# Patient Record
Sex: Female | Born: 1995 | Race: White | Hispanic: No | Smoking: Former smoker
Health system: Southern US, Community
[De-identification: ages and names within clinical notes are randomized; demographics above are authoritative.]

## PROBLEM LIST (undated history)

## (undated) ENCOUNTER — Inpatient Hospital Stay: Payer: Self-pay

## (undated) DIAGNOSIS — N83209 Unspecified ovarian cyst, unspecified side: Secondary | ICD-10-CM

## (undated) DIAGNOSIS — Z789 Other specified health status: Secondary | ICD-10-CM

## (undated) HISTORY — DX: Other specified health status: Z78.9

## (undated) HISTORY — PX: MOUTH SURGERY: SHX715

## (undated) HISTORY — DX: Unspecified ovarian cyst, unspecified side: N83.209

---
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
## 2017-01-01 ENCOUNTER — Emergency Department
Admission: EM | Admit: 2017-01-01 | Discharge: 2017-01-01 | Disposition: A | Discharge status: Home / Self Care | Payer: Self-pay | Attending: Emergency Medicine | Admitting: Emergency Medicine

## 2017-01-01 ENCOUNTER — Encounter: Payer: Self-pay | Admitting: Medical Oncology

## 2017-01-01 DIAGNOSIS — Z79899 Other long term (current) drug therapy: Secondary | ICD-10-CM | POA: Insufficient documentation

## 2017-01-01 DIAGNOSIS — Z791 Long term (current) use of non-steroidal anti-inflammatories (NSAID): Secondary | ICD-10-CM | POA: Insufficient documentation

## 2017-01-01 DIAGNOSIS — B86 Scabies: Secondary | ICD-10-CM | POA: Insufficient documentation

## 2017-01-01 DIAGNOSIS — L249 Irritant contact dermatitis, unspecified cause: Secondary | ICD-10-CM | POA: Insufficient documentation

## 2017-01-01 DIAGNOSIS — F17210 Nicotine dependence, cigarettes, uncomplicated: Secondary | ICD-10-CM | POA: Insufficient documentation

## 2017-01-01 MED ORDER — PERMETHRIN 5 % EX CREA: TOPICAL_CREAM | CUTANEOUS | 0 refills | Status: DC

## 2017-01-01 MED ORDER — TRIAMCINOLONE ACETONIDE 0.5 % EX OINT: 1.0000 "application " | TOPICAL_OINTMENT | Freq: Two times a day (BID) | CUTANEOUS | 0 refills | Status: DC

## 2017-01-01 NOTE — ED Triage Notes (Signed)
Itchy rash scattered on body x 1 week

## 2017-01-01 NOTE — Discharge Instructions (Signed)
You are being treated for a potential scabies exposure. Use the lotion as directed. Wait until the lotion is rinsed, to begin using the steroid cream as needed. Follow-up with your provider as needed. Take OTC Benadryl 25 mg and ranitidine (Zantac) 150 mg as directed for itch relief. Avoid long, hot showers, until symptoms resolve.

## 2017-01-01 NOTE — ED Provider Notes (Signed)
[facility] Provider Note ____________________________________________  Time seen: 1402  I have reviewed the triage vital signs and the nursing notes.  HISTORY  Chief Complaint  Rash  HPI Glendell DockerKacy L Piazza is a 21 y.o. female presents to the ED for evaluation of an itchy rash throughout the body for the last week. Patient admits to sleeping at a friend's home for about a week, prior to the onset of the rash. She presents now with multiple small papular lesions noted to the wrist, bilateral forearms, axilla, and around the waist. She also has some erythema into the back of the neck and scalp. She denies any fevers, chills, sweats. She also denies any similar symptoms in her girlfriend or other friends. She admits to showering twice daily as a compulsion. She denies any other contacts, exposures, or sensitivities.  History reviewed. No pertinent past medical history.  There are no active problems to display for this patient.  History reviewed. No pertinent surgical history.  Prior to Admission medications   Medication Sig Start Date End Date Taking? Authorizing Provider  meloxicam (MOBIC) 15 MG tablet Take 1 tablet (15 mg total) by mouth daily. 09/19/16   Cuthriell, Delorise RoyalsJonathan D, PA-C  metaxalone (SKELAXIN) 800 MG tablet Take 1/2 to 1 tab up to TID prn muscle pain 09/25/16   Halston Kintz, Charlesetta IvoryJenise V Bacon, PA-C  methocarbamol (ROBAXIN) 500 MG tablet Take 1 tablet (500 mg total) by mouth 4 (four) times daily. 09/19/16   Cuthriell, Delorise RoyalsJonathan D, PA-C  ondansetron (ZOFRAN ODT) 4 MG disintegrating tablet Take 1 tablet (4 mg total) by mouth every 8 (eight) hours as needed for nausea or vomiting. 09/25/16   Heide Brossart, Charlesetta IvoryJenise V Bacon, PA-C  permethrin (ELIMITE) 5 % cream Apply from neck to feet x 1 Rinse after 24 hours. 01/01/17   Jonica Bickhart, Charlesetta IvoryJenise V Bacon, PA-C  triamcinolone ointment (KENALOG) 0.5 % Apply 1 application topically 2 (two) times daily. 01/01/17    Viera Okonski, Charlesetta IvoryJenise V Bacon, PA-C    Allergies Patient has no known allergies.  No family history on file.  Social History Social History  Substance Use Topics  . Smoking status: Current Every Day Smoker    Packs/day: 0.25    Types: Cigarettes  . Smokeless tobacco: Never Used  . Alcohol use Yes     Comment: Social     Review of Systems  Constitutional: Negative for fever. Eyes: Negative for visual changes. ENT: Negative for sore throat. Cardiovascular: Negative for chest pain. Respiratory: Negative for shortness of breath. Musculoskeletal: Negative for back pain. Skin: Positive for rash. Neurological: Negative for headaches, focal weakness or numbness. ____________________________________________  PHYSICAL EXAM:  VITAL SIGNS: ED Triage Vitals  Enc Vitals Group     BP 01/01/17 1334 120/67     Pulse Rate 01/01/17 1334 71     Resp 01/01/17 1334 18     Temp 01/01/17 1334 98.3 F (36.8 C)     Temp Source 01/01/17 1334 Oral     SpO2 01/01/17 1334 97 %     Weight 01/01/17 1333 122 lb (55.3 kg)     Height --      Head Circumference --      Peak Flow --      Pain Score --      Pain Loc --      Pain Edu? --      Excl. in GC? --     Constitutional: Alert and oriented. Well appearing and in no distress. Head: Normocephalic  and atraumatic. Eyes: Conjunctivae are normal. Normal extraocular movements Neck: Supple. No thyromegaly. Hematological/Lymphatic/Immunological: No cervical lymphadenopathy. Cardiovascular: Normal rate, regular rhythm. Normal distal pulses. Respiratory: Normal respiratory effort. No wheezes/rales/rhonchi. Skin:  Skin is warm, dry and intact. Multiple scattered, slightly erythematous, follicular-type eruptions over the wrists, axilla, torso, and extremities.  ____________________________________________  INITIAL IMPRESSION / ASSESSMENT AND PLAN / ED COURSE  Patient with the ED evaluation of a itchy rash with an unknown etiology at this time. The  follicular appearing rash appears to be focused to her wrist and axilla as well as runaways. There is concern that she was out of her own personal space which could lead to possible scabies or bedbugs. These particular eruptions appear to be more itchy at night according to the patient. As such she'll be discharged with a prescription for permethrin lotion to use as directed. He is also advised to take over-the-counter Benadryl and Zantac for histamine blockade. A prescription for triamcinolone ointment is also provided that she may use after the treatment with the permethrin lotion. She should follow with her primary care provider or return to the ED as needed. ____________________________________________  FINAL CLINICAL IMPRESSION(S) / ED DIAGNOSES  Final diagnoses:  Scabies  Irritant contact dermatitis, unspecified trigger      Karmen Stabs, Charlesetta Ivory, PA-C 01/01/17 1816    Myrna Blazer, MD 01/01/17 2126

## 2017-01-01 NOTE — ED Notes (Signed)
See triage note  Has generalized rash for a few days areas noted to both arms,wrist ares and legs  No resp distress noted

## 2017-01-12 ENCOUNTER — Emergency Department (HOSPITAL_COMMUNITY)
Admission: EM | Admit: 2017-01-12 | Discharge: 2017-01-12 | Disposition: A | Discharge status: Home / Self Care | Payer: Self-pay | Attending: Emergency Medicine | Admitting: Emergency Medicine

## 2017-01-12 ENCOUNTER — Encounter (HOSPITAL_COMMUNITY): Payer: Self-pay | Admitting: Emergency Medicine

## 2017-01-12 DIAGNOSIS — Z79899 Other long term (current) drug therapy: Secondary | ICD-10-CM | POA: Insufficient documentation

## 2017-01-12 DIAGNOSIS — R21 Rash and other nonspecific skin eruption: Secondary | ICD-10-CM | POA: Insufficient documentation

## 2017-01-12 DIAGNOSIS — J029 Acute pharyngitis, unspecified: Secondary | ICD-10-CM

## 2017-01-12 DIAGNOSIS — F17210 Nicotine dependence, cigarettes, uncomplicated: Secondary | ICD-10-CM | POA: Insufficient documentation

## 2017-01-12 DIAGNOSIS — R590 Localized enlarged lymph nodes: Secondary | ICD-10-CM | POA: Insufficient documentation

## 2017-01-12 DIAGNOSIS — L42 Pityriasis rosea: Secondary | ICD-10-CM | POA: Insufficient documentation

## 2017-01-12 DIAGNOSIS — J3489 Other specified disorders of nose and nasal sinuses: Secondary | ICD-10-CM | POA: Insufficient documentation

## 2017-01-12 LAB — RAPID STREP SCREEN (MED CTR MEBANE ONLY): STREPTOCOCCUS, GROUP A SCREEN (DIRECT): NEGATIVE

## 2017-01-12 MED ORDER — CHLORHEXIDINE GLUCONATE 0.12 % MT SOLN: 15.0000 mL | Freq: Two times a day (BID) | OROMUCOSAL | 0 refills | Status: DC

## 2017-01-12 MED ORDER — HYDROCODONE-HOMATROPINE 5-1.5 MG/5ML PO SYRP: 5.0000 mL | ORAL_SOLUTION | Freq: Four times a day (QID) | ORAL | 0 refills | Status: DC | PRN

## 2017-01-12 NOTE — ED Triage Notes (Signed)
Patient reports sore throat for 2 days with occasional dry cough , denies fever or chills , respirations unlabored / airway intact .

## 2017-01-12 NOTE — ED Provider Notes (Signed)
[facility]-EMERGENCY DEPT Provider Note   CSN: [id number] Arrival date & time: 01/12/17  0128     History   Chief Complaint Chief Complaint  Patient presents with  . Sore Throat    HPI Jacqueline Villarreal is a 21 y.o. female.  HPI   21 year old female presenting complaining of sore throat. Patient states for the past 5 days she has had sinus congestion, occasional sneezing, having progressive worsening sore throat and now having ear pain. Increased low throat with swallowing and with talking. She denies associated fever, severe headache, neck pain, change in her voice, abdominal pain, nausea vomiting diarrhea, or shortness of breath. She also endorsed an itchy rash throughout her body ongoing for the past 2 weeks without any change in her environment. She denies any recent sick contact. She denies any hearing loss. She was seen a week ago for her rash and was prescribed permethrin cream which she used but it did provide any relief.  History reviewed. No pertinent past medical history.  There are no active problems to display for this patient.   History reviewed. No pertinent surgical history.  OB History    No data available       Home Medications    Prior to Admission medications   Medication Sig Start Date End Date Taking? Authorizing Provider  meloxicam (MOBIC) 15 MG tablet Take 1 tablet (15 mg total) by mouth daily. 09/19/16   Cuthriell, Delorise Royals, PA-C  metaxalone (SKELAXIN) 800 MG tablet Take 1/2 to 1 tab up to TID prn muscle pain 09/25/16   Menshew, Charlesetta Ivory, PA-C  methocarbamol (ROBAXIN) 500 MG tablet Take 1 tablet (500 mg total) by mouth 4 (four) times daily. 09/19/16   Cuthriell, Delorise Royals, PA-C  ondansetron (ZOFRAN ODT) 4 MG disintegrating tablet Take 1 tablet (4 mg total) by mouth every 8 (eight) hours as needed for nausea or vomiting. 09/25/16   Menshew, Charlesetta Ivory, PA-C  permethrin (ELIMITE) 5 % cream Apply from neck to feet x 1 Rinse after 24 hours. 01/01/17    Menshew, Charlesetta Ivory, PA-C  triamcinolone ointment (KENALOG) 0.5 % Apply 1 application topically 2 (two) times daily. 01/01/17   Menshew, Charlesetta Ivory, PA-C    Family History No family history on file.  Social History Social History  Substance Use Topics  . Smoking status: Current Every Day Smoker    Packs/day: 0.25    Types: Cigarettes  . Smokeless tobacco: Never Used  . Alcohol use Yes     Comment: Social      Allergies   Patient has no known allergies.   Review of Systems Review of Systems  All other systems reviewed and are negative.    Physical Exam Updated Vital Signs BP 133/76 (BP Location: Right Arm)   Pulse 91   Temp 98.1 F (36.7 C) (Oral)   Resp 18   SpO2 100%   Physical Exam  Constitutional: She appears well-developed and well-nourished. No distress.  Well-appearing in no acute discomfort.  HENT:  Head: Atraumatic.  Ears: Normal TMs bilaterally Nose: Mild rhinorrhea but normal turbinate Throat: Uvula is midline no tonsillar enlargement or exudates, no trismus  Eyes: Conjunctivae are normal.  Neck: Neck supple.  No nuchal rigidity  Cardiovascular: Normal rate and regular rhythm.   Pulmonary/Chest: Effort normal and breath sounds normal.  Abdominal: Soft. Bowel sounds are normal. She exhibits no distension. There is no tenderness.  No abdominal tenderness, no splenomegaly  Lymphadenopathy:    She  has cervical adenopathy.  Neurological: She is alert.  Skin: Rash (Multiple small ovoid salmon color lesion noted throughout body) noted.  Psychiatric: She has a normal mood and affect.  Nursing note and vitals reviewed.    ED Treatments / Results  Labs (all labs ordered are listed, but only abnormal results are displayed) Labs Reviewed  [facility]ID [facility])  CULTURE, GROUP A STREP [facility])    EKG  EKG Interpretation None       Radiology No results found.  Procedures Procedures (including critical care  time)  Medications Ordered in ED Medications - No data to display   Initial Impression / Assessment and Plan / ED Course  I have reviewed the triage vital signs and the nursing notes.  Pertinent labs & imaging results that were available during my care of the patient were reviewed by me and considered in my medical decision making (see chart for details).     BP 133/76 (BP Location: Right Arm)   Pulse 91   Temp 98.1 F (36.7 C) (Oral)   Resp 18   SpO2 100%    Final Clinical Impressions(s) / ED Diagnoses   Final diagnoses:  Pityriasis rosea  Viral pharyngitis    New Prescriptions New Prescriptions   CHLORHEXIDINE (PERIDEX) 0.12 % SOLUTION    Use as directed 15 mLs in the mouth or throat 2 (two) times daily.   HYDROCODONE-HOMATROPINE (HYCODAN) 5-1.5 MG/5ML SYRUP    Take 5 mLs by mouth every 6 (six) hours as needed for cough (or sore throat).   4:50 AM Patient here with itchy rash with appearance suggestive of arises prosaic. Reassurance given. She also has URI symptoms and sore throat. Her throat exam is unremarkable. No evidence to suggest deep tissue infection such as peritonsillar abscess or retropharyngeal abscess. Patient will receive symptomatic treatment. Return precaution discussed. She is stable for discharge. She is afebrile with stable normal vital sign.  In order to decrease risk of narcotic abuse. Pt's record were checked using the [place] Controlled Substance database.    Fayrene Helperran, Alyn Jurney, PA-C 01/12/17 [id number]    Gilda CreasePollina, Christopher J, MD 01/12/17 0630

## 2017-01-12 NOTE — ED Notes (Signed)
Pt reports generalized rash throughout body that does not hurt at the moment. Pt states itchiness has decreased past few days.

## 2017-01-12 NOTE — ED Notes (Signed)
ED Provider at bedside. 

## 2017-01-14 LAB — CULTURE, GROUP A STREP (THRC)

## 2017-05-20 ENCOUNTER — Encounter: Payer: Self-pay | Admitting: Emergency Medicine

## 2017-05-20 ENCOUNTER — Other Ambulatory Visit: Payer: Self-pay

## 2017-05-20 ENCOUNTER — Emergency Department
Admission: EM | Admit: 2017-05-20 | Discharge: 2017-05-20 | Disposition: A | Discharge status: Home / Self Care | Payer: Self-pay | Attending: Emergency Medicine | Admitting: Emergency Medicine

## 2017-05-20 DIAGNOSIS — J029 Acute pharyngitis, unspecified: Secondary | ICD-10-CM | POA: Insufficient documentation

## 2017-05-20 DIAGNOSIS — F17290 Nicotine dependence, other tobacco product, uncomplicated: Secondary | ICD-10-CM | POA: Insufficient documentation

## 2017-05-20 DIAGNOSIS — B349 Viral infection, unspecified: Secondary | ICD-10-CM | POA: Insufficient documentation

## 2017-05-20 LAB — POCT RAPID STREP A: Streptococcus, Group A Screen (Direct): NEGATIVE

## 2017-05-20 LAB — POCT PREGNANCY, URINE: Preg Test, Ur: NEGATIVE

## 2017-05-20 MED ORDER — HYDROCOD POLST-CPM POLST ER 10-8 MG/5ML PO SUER
5.0000 mL | Freq: Two times a day (BID) | ORAL | 0 refills | Status: DC
Start: 2017-05-20 — End: 2017-10-20

## 2017-05-20 MED ORDER — IBUPROFEN 100 MG/5ML PO SUSP
ORAL | Status: AC
  Filled 2017-05-20: qty 5

## 2017-05-20 MED ORDER — IBUPROFEN 100 MG/5ML PO SUSP
600.0000 mg | Freq: Once | ORAL | Status: AC
  Administered 2017-05-20: 600 mg via ORAL

## 2017-05-20 MED ORDER — HYDROCOD POLST-CPM POLST ER 10-8 MG/5ML PO SUER: 5.0000 mL | Freq: Two times a day (BID) | ORAL | 0 refills | Status: DC

## 2017-05-20 MED ORDER — HYDROCOD POLST-CPM POLST ER 10-8 MG/5ML PO SUER
5.0000 mL | Freq: Once | ORAL | Status: AC
  Administered 2017-05-20: 5 mL via ORAL
  Filled 2017-05-20: qty 5

## 2017-05-20 MED ORDER — PREDNISONE 20 MG PO TABS: 60.0000 mg | ORAL_TABLET | Freq: Once | ORAL | Status: DC

## 2017-05-20 MED ORDER — PREDNISONE 10 MG (21) PO TBPK: ORAL_TABLET | Freq: Every day | ORAL | 0 refills | Status: DC

## 2017-05-20 MED ORDER — ONDANSETRON 4 MG PO TBDP
4.0000 mg | ORAL_TABLET | Freq: Once | ORAL | Status: AC
  Administered 2017-05-20: 4 mg via ORAL
  Filled 2017-05-20: qty 1

## 2017-05-20 NOTE — ED Notes (Signed)
Reviewed d/c instructions, follow-up care, prescriptions with patient. Patient verbalized understanding.  

## 2017-05-20 NOTE — ED Triage Notes (Signed)
Pt to ED c/o sore throat, left neck pain, and nausea x4 days, fever today.  Patient taking mucinex at home.  Also last period September 21st.

## 2017-05-20 NOTE — ED Provider Notes (Signed)
[facility] Emergency Department Provider Note       Time seen: ----------------------------------------- 10:08 PM on 05/20/2017 -----------------------------------------    I have reviewed the triage vital signs and the nursing notes.  HISTORY   Chief Complaint Sore Throat and Fever    HPI Glendell DockerKacy L Shelvin is a 21 y.o. female with a history of sore throat, swollen lymph nodes and nausea for the past 4 days.  Patient reports she has had some fever today and she is taking Mucinex at home.  Pain is 8 out of 10, she describes it as aching.  Nothing makes it better or worse.  History reviewed. No pertinent past medical history.  There are no active problems to display for this patient.   History reviewed. No pertinent surgical history.  Allergies Patient has no known allergies.  Social History Social History   Tobacco Use  . Smoking status: Current Every Day Smoker    Types: E-cigarettes  . Smokeless tobacco: Never Used  Substance Use Topics  . Alcohol use: No    Frequency: Never  . Drug use: No   Review of Systems Constitutional: Negative for fever. Eyes: Negative for vision changes ENT:  Negative for congestion, positive for sore throat, lymphadenopathy Cardiovascular: Negative for chest pain. Respiratory: Negative for shortness of breath. Gastrointestinal: Negative for abdominal pain, positive for nausea Genitourinary: Negative for dysuria. Musculoskeletal: Negative for back pain. Skin: Negative for rash. Neurological: Negative for headaches, focal weakness or numbness.  All systems negative/normal/unremarkable except as stated in the HPI  ____________________________________________   PHYSICAL EXAM:  VITAL SIGNS: ED Triage Vitals  Enc Vitals Group     BP 05/20/17 2148 102/72     Pulse Rate 05/20/17 2148 (!) 108     Resp 05/20/17 2148 18     Temp 05/20/17 2148 100.2 F (37.9 C)     Temp Source 05/20/17 2148 Oral     SpO2  05/20/17 2148 100 %     Weight 05/20/17 2149 104 lb (47.2 kg)     Height 05/20/17 2149 5\' 4"  (1.626 m)     Head Circumference --      Peak Flow --      Pain Score 05/20/17 2151 8     Pain Loc --      Pain Edu? --      Excl. in GC? --    Constitutional: Alert and oriented. Well appearing and in no distress. Eyes: Conjunctivae are normal. Normal extraocular movements. ENT   Head: Normocephalic and atraumatic.   Nose: No congestion/rhinnorhea.   Mouth/Throat: Erythema with no tonsillar hypertrophy   Neck: Moderate bilateral anterior adenopathy Cardiovascular: Normal rate, regular rhythm. No murmurs, rubs, or gallops. Respiratory: Normal respiratory effort without tachypnea nor retractions. Breath sounds are clear and equal bilaterally. No wheezes/rales/rhonchi. Gastrointestinal: Soft and nontender. Normal bowel sounds Musculoskeletal: Nontender with normal range of motion in extremities. No lower extremity tenderness nor edema. Skin:  Skin is warm, dry and intact. No rash noted.  ED COURSE:  Pertinent labs & imaging results that were available during my care of the patient were reviewed by me and considered in my medical decision making (see chart for details). Patient presents for viral symptoms, we will assess with rapid strept and poc urine pregnancy   Procedures ____________________________________________   LABS (pertinent positives/negatives)  Labs Reviewed  CULTURE, GROUP A STREP [facility])  POCT RAPID STREP A  POCT PREGNANCY, URINE  POC URINE PREG, ED   ____________________________________________  DIFFERENTIAL DIAGNOSIS  Viral pharyngitis, strep pharyngitis, mononucleosis, pregnancy  FINAL ASSESSMENT AND PLAN  Pharyngitis   Plan: Patient had presented for sore throat. Patient's labs were negative for strep and pregnancy.  She has a very benign examination and likely has a viral etiology with reactive lymphadenopathy.  She will be discharged with a  steroid taper as well as cough and cold medications.  She is stable for outpatient follow-up   Emily FilbertWilliams, Claire Dolores E, MD   Note: This note was generated in part or whole with voice recognition software. Voice recognition is usually quite accurate but there are transcription errors that can and very often do occur. I apologize for any typographical errors that were not detected and corrected.     Emily FilbertWilliams, Clarie Camey E, MD 05/20/17 2231

## 2017-05-20 NOTE — ED Notes (Signed)
Rapid strep negative per Jeannett SeniorStephen, RN

## 2017-05-23 LAB — CULTURE, GROUP A STREP (THRC)

## 2017-10-04 ENCOUNTER — Encounter: Payer: Self-pay | Admitting: Emergency Medicine

## 2017-10-04 ENCOUNTER — Emergency Department
Admission: EM | Admit: 2017-10-04 | Discharge: 2017-10-04 | Disposition: A | Discharge status: Home / Self Care | Payer: Self-pay | Attending: Emergency Medicine | Admitting: Emergency Medicine

## 2017-10-04 ENCOUNTER — Other Ambulatory Visit: Payer: Self-pay

## 2017-10-04 DIAGNOSIS — Z79899 Other long term (current) drug therapy: Secondary | ICD-10-CM | POA: Insufficient documentation

## 2017-10-04 DIAGNOSIS — N760 Acute vaginitis: Secondary | ICD-10-CM | POA: Insufficient documentation

## 2017-10-04 DIAGNOSIS — F17210 Nicotine dependence, cigarettes, uncomplicated: Secondary | ICD-10-CM | POA: Insufficient documentation

## 2017-10-04 DIAGNOSIS — N939 Abnormal uterine and vaginal bleeding, unspecified: Secondary | ICD-10-CM

## 2017-10-04 LAB — URINALYSIS, COMPLETE (UACMP) WITH MICROSCOPIC
BACTERIA UA: NONE SEEN
Bilirubin Urine: NEGATIVE
Glucose, UA: NEGATIVE mg/dL
Ketones, ur: NEGATIVE mg/dL
Leukocytes, UA: NEGATIVE
Nitrite: NEGATIVE
PROTEIN: NEGATIVE mg/dL
Specific Gravity, Urine: 1.024 (ref 1.005–1.030)
pH: 6 (ref 5.0–8.0)

## 2017-10-04 LAB — WET PREP, GENITAL
CLUE CELLS WET PREP: NONE SEEN
Sperm: NONE SEEN
TRICH WET PREP: NONE SEEN
Yeast Wet Prep HPF POC: NONE SEEN

## 2017-10-04 LAB — POCT PREGNANCY, URINE: Preg Test, Ur: NEGATIVE

## 2017-10-04 LAB — CHLAMYDIA/NGC RT PCR (ARMC ONLY)
Chlamydia Tr: NOT DETECTED
N GONORRHOEAE: NOT DETECTED

## 2017-10-04 MED ORDER — METRONIDAZOLE 500 MG PO TABS: 500.0000 mg | ORAL_TABLET | Freq: Two times a day (BID) | ORAL | 0 refills | Status: AC

## 2017-10-04 NOTE — ED Notes (Signed)
Pt discharged home after verbalizing understanding of discharge instructions; nad noted. 

## 2017-10-04 NOTE — ED Notes (Signed)
See triage note  Presents with some abd cramping and then she noticed some blood in urine
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
## 2017-10-20 ENCOUNTER — Other Ambulatory Visit: Payer: Self-pay

## 2017-10-20 ENCOUNTER — Emergency Department: Payer: Self-pay

## 2017-10-20 ENCOUNTER — Emergency Department
Admission: EM | Admit: 2017-10-20 | Discharge: 2017-10-20 | Disposition: A | Discharge status: Home / Self Care | Payer: Self-pay | Attending: Emergency Medicine | Admitting: Emergency Medicine

## 2017-10-20 DIAGNOSIS — G8929 Other chronic pain: Secondary | ICD-10-CM | POA: Insufficient documentation

## 2017-10-20 DIAGNOSIS — F17290 Nicotine dependence, other tobacco product, uncomplicated: Secondary | ICD-10-CM | POA: Insufficient documentation

## 2017-10-20 DIAGNOSIS — M545 Low back pain: Secondary | ICD-10-CM | POA: Insufficient documentation

## 2017-10-20 LAB — URINALYSIS, COMPLETE (UACMP) WITH MICROSCOPIC
Bilirubin Urine: NEGATIVE
Glucose, UA: NEGATIVE mg/dL
Hgb urine dipstick: NEGATIVE
KETONES UR: NEGATIVE mg/dL
Nitrite: NEGATIVE
PH: 5 (ref 5.0–8.0)
Protein, ur: NEGATIVE mg/dL
Specific Gravity, Urine: 1.02 (ref 1.005–1.030)

## 2017-10-20 LAB — POCT PREGNANCY, URINE: Preg Test, Ur: NEGATIVE

## 2017-10-20 MED ORDER — MELOXICAM 15 MG PO TABS: 15.0000 mg | ORAL_TABLET | Freq: Every day | ORAL | 0 refills | Status: DC

## 2017-10-20 MED ORDER — KETOROLAC TROMETHAMINE 30 MG/ML IJ SOLN
30.0000 mg | Freq: Once | INTRAMUSCULAR | Status: AC
  Administered 2017-10-20: 30 mg via INTRAMUSCULAR
  Filled 2017-10-20: qty 1
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
## 2017-10-20 NOTE — ED Notes (Signed)
Pt verbalizes understanding of d/c instructions, medications and f/u 
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
## 2017-10-20 NOTE — Discharge Instructions (Addendum)
You will need to establish a primary care provider.  You may also [facility].  Their information is listed on your discharge papers.  Begin taking meloxicam 15 mg daily with food.  Take this medication every day for the next 30 days.  You may use ice or heat to your back.  Move frequently to avoid soreness and stiffness.
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
## 2018-02-22 ENCOUNTER — Emergency Department
Admission: EM | Admit: 2018-02-22 | Discharge: 2018-02-22 | Disposition: A | Discharge status: Home / Self Care | Payer: Self-pay | Attending: Emergency Medicine | Admitting: Emergency Medicine

## 2018-02-22 ENCOUNTER — Emergency Department: Payer: Self-pay

## 2018-02-22 ENCOUNTER — Encounter: Payer: Self-pay | Admitting: Emergency Medicine

## 2018-02-22 ENCOUNTER — Other Ambulatory Visit: Payer: Self-pay

## 2018-02-22 DIAGNOSIS — R102 Pelvic and perineal pain: Secondary | ICD-10-CM | POA: Insufficient documentation

## 2018-02-22 DIAGNOSIS — R1030 Lower abdominal pain, unspecified: Secondary | ICD-10-CM | POA: Insufficient documentation

## 2018-02-22 DIAGNOSIS — F17210 Nicotine dependence, cigarettes, uncomplicated: Secondary | ICD-10-CM | POA: Insufficient documentation

## 2018-02-22 DIAGNOSIS — Z79899 Other long term (current) drug therapy: Secondary | ICD-10-CM | POA: Insufficient documentation

## 2018-02-22 LAB — COMPREHENSIVE METABOLIC PANEL
ALT: 12 U/L (ref 0–44)
AST: 22 U/L (ref 15–41)
Albumin: 4.7 g/dL (ref 3.5–5.0)
Alkaline Phosphatase: 52 U/L (ref 38–126)
Anion gap: 7 (ref 5–15)
BILIRUBIN TOTAL: 1.1 mg/dL (ref 0.3–1.2)
BUN: 8 mg/dL (ref 6–20)
CHLORIDE: 107 mmol/L (ref 98–111)
CO2: 26 mmol/L (ref 22–32)
CREATININE: 0.54 mg/dL (ref 0.44–1.00)
Calcium: 9.4 mg/dL (ref 8.9–10.3)
Glucose, Bld: 110 mg/dL — ABNORMAL HIGH (ref 70–99)
Potassium: 3.7 mmol/L (ref 3.5–5.1)
Sodium: 140 mmol/L (ref 135–145)
TOTAL PROTEIN: 7.4 g/dL (ref 6.5–8.1)

## 2018-02-22 LAB — URINALYSIS, COMPLETE (UACMP) WITH MICROSCOPIC
BILIRUBIN URINE: NEGATIVE
Bacteria, UA: NONE SEEN
GLUCOSE, UA: NEGATIVE mg/dL
HGB URINE DIPSTICK: NEGATIVE
KETONES UR: 20 mg/dL — AB
LEUKOCYTES UA: NEGATIVE
NITRITE: NEGATIVE
PH: 5 (ref 5.0–8.0)
PROTEIN: NEGATIVE mg/dL
Specific Gravity, Urine: 1.006 (ref 1.005–1.030)

## 2018-02-22 LAB — CHLAMYDIA/NGC RT PCR (ARMC ONLY)
Chlamydia Tr: NOT DETECTED
N gonorrhoeae: NOT DETECTED

## 2018-02-22 LAB — CBC WITH DIFFERENTIAL/PLATELET
BASOS ABS: 0 10*3/uL (ref 0–0.1)
BASOS PCT: 0 %
EOS ABS: 0.1 10*3/uL (ref 0–0.7)
Eosinophils Relative: 2 %
HCT: 40 % (ref 35.0–47.0)
Hemoglobin: 13.7 g/dL (ref 12.0–16.0)
LYMPHS ABS: 1.3 10*3/uL (ref 1.0–3.6)
Lymphocytes Relative: 23 %
MCH: 30.4 pg (ref 26.0–34.0)
MCHC: 34.3 g/dL (ref 32.0–36.0)
MCV: 88.7 fL (ref 80.0–100.0)
Monocytes Absolute: 0.4 10*3/uL (ref 0.2–0.9)
Monocytes Relative: 7 %
NEUTROS PCT: 68 %
Neutro Abs: 3.8 10*3/uL (ref 1.4–6.5)
Platelets: 161 10*3/uL (ref 150–440)
RBC: 4.51 MIL/uL (ref 3.80–5.20)
RDW: 12.8 % (ref 11.5–14.5)
WBC: 5.7 10*3/uL (ref 3.6–11.0)

## 2018-02-22 LAB — LIPASE, BLOOD: LIPASE: 36 U/L (ref 11–51)

## 2018-02-22 LAB — POCT PREGNANCY, URINE: Preg Test, Ur: NEGATIVE

## 2018-02-22 MED ORDER — SODIUM CHLORIDE 0.9 % IV BOLUS
1000.0000 mL | Freq: Once | INTRAVENOUS | Status: AC
  Administered 2018-02-22: 1000 mL via INTRAVENOUS

## 2018-02-22 NOTE — ED Triage Notes (Signed)
Patient ambulatory to triage with steady gait, without difficulty or distress noted; pt reports x 2wks having nausea and lower abd pain; denies hx of same

## 2018-02-22 NOTE — ED Notes (Signed)
Patient went to the bathroom, and collected a UA sample. Sent to the lab.

## 2018-02-22 NOTE — ED Provider Notes (Addendum)
Ultrasound shows a small to moderate amount of free fluid.  This could be due to a cyst otherwise ultrasound is normal patient reports the pain is considerably better she does not want to take any medicine but does ask for blood to be drawn to check for STD.  She is not particularly interested in a pelvic exam however.  Patient does want to go home.  We will collect the urine and blood let her go she will return if she is worse if the pain recurs if she gets a fever vomiting or feels sicker at all.   Arnaldo NatalMalinda, Rashied Corallo F, MD 02/22/18 [phone]    Arnaldo NatalMalinda, Bryana Froemming F, MD 02/22/18 [phone]

## 2018-02-22 NOTE — ED Provider Notes (Signed)
[facility] Emergency Department Provider Note ____________________________________________   First MD Initiated Contact with Patient 02/22/18 0507     (approximate)  I have reviewed the triage vital signs and the nursing notes.   HISTORY  Chief Complaint Abdominal Pain    HPI Jacqueline Villarreal is a 22 y.o. female with PMH as noted below who presents with lower abdominal pain, acute onset approximately 4 hours ago, severe in intensity, and radiating around to bilateral flanks.  She states that the pain is slightly worse on the left.  Patient also reports some nausea over the last 2 weeks but no vomiting.  She states it is worse after she eats.  She also reports some diarrhea during the last 2 weeks.  She denies fever, urinary symptoms, or vaginal discharge but she does report some spotting.  She states that her last period was over 1 month ago.  No prior history of this pain.   History reviewed. No pertinent past medical history.  There are no active problems to display for this patient.   Past Surgical History:  Procedure Laterality Date  . MOUTH SURGERY      Prior to Admission medications   Medication Sig Start Date End Date Taking? Authorizing Provider  meloxicam (MOBIC) 15 MG tablet Take 1 tablet (15 mg total) by mouth daily. 10/20/17 10/20/18  Tommi RumpsSummers, Rhonda L, PA-C    Allergies Patient has no known allergies.  No family history on file.  Social History Social History   Tobacco Use  . Smoking status: Current Every Day Smoker    Types: E-cigarettes  . Smokeless tobacco: Never Used  Substance Use Topics  . Alcohol use: No    Frequency: Never  . Drug use: No    Review of Systems  Constitutional: No fever. Eyes: No redness. ENT: No sore throat. Cardiovascular: Denies chest pain. Respiratory: Denies shortness of breath. Gastrointestinal: Positive for nausea and diarrhea.  Genitourinary: Negative for dysuria.  Musculoskeletal:  Negative for back pain. Skin: Negative for rash. Neurological: Negative for headache.   ____________________________________________   PHYSICAL EXAM:  VITAL SIGNS: ED Triage Vitals  Enc Vitals Group     BP 02/22/18 0501 100/83     Pulse Rate 02/22/18 0501 65     Resp 02/22/18 0501 16     Temp 02/22/18 0501 98.3 F (36.8 C)     Temp Source 02/22/18 0501 Oral     SpO2 02/22/18 0501 100 %     Weight 02/22/18 0500 105 lb (47.6 kg)     Height 02/22/18 0500 5\' 4"  (1.626 m)     Head Circumference --      Peak Flow --      Pain Score 02/22/18 0500 8     Pain Loc --      Pain Edu? --      Excl. in GC? --     Constitutional: Alert and oriented.  Slightly uncomfortable appearing but in no acute distress.   Eyes: Conjunctivae are normal.  Head: Atraumatic. Nose: No congestion/rhinnorhea. Mouth/Throat: Mucous membranes are moist.   Neck: Normal range of motion.  Cardiovascular: Good peripheral circulation. Respiratory: Normal respiratory effort.  Gastrointestinal: Soft with bilateral suprapubic and lower quadrant tenderness slightly worse on the left. No distention.  Genitourinary: No flank tenderness. Musculoskeletal:  Extremities warm and well perfused.  Neurologic:  Normal speech and language. No gross focal neurologic deficits are appreciated.  Skin:  Skin is warm and dry. No rash noted. Psychiatric: Mood  and affect are normal. Speech and behavior are normal.  ____________________________________________   LABS (all labs ordered are listed, but only abnormal results are displayed)  Labs Reviewed  COMPREHENSIVE METABOLIC PANEL - Abnormal; Notable for the following components:      Result Value   Glucose, Bld 110 (*)    All other components within normal limits  CBC WITH DIFFERENTIAL/PLATELET  LIPASE, BLOOD  URINALYSIS, COMPLETE (UACMP) WITH MICROSCOPIC  POC URINE PREG, ED    ____________________________________________  EKG   ____________________________________________  RADIOLOGY  US pelvis: Pending  ____________________________________________   PROCEDURES  Procedure(s) performed: No  Procedures  Critical Care performed: No ____________________________________________   INITIAL IMPRESSION / ASSESSMENT AND PLAN / ED COURSE  Pertinent labs & imaging results that were available during my care of the patient were reviewed by me and considered in my medical decision making (see chart for details).  22 year old female with PMH as noted above presents with acute onset of lower abdominal pain over the last several hours, as well as some nausea and diarrhea over the last 2 weeks.  On exam, the patient is relatively well-appearing, the vital signs are normal, and the abdomen is soft but tender in bilateral lower quadrants, somewhat worse on the left.  Given the acute and severe nature of the pain as well as the location, differential includes most likely ruptured ovarian cyst, mittelschmerz, or less likely ovarian torsion.  Differential also includes colitis, diverticulitis, or less likely appendicitis.  We will obtain labs, pelvic ultrasound, and reassess.  If pelvic ultrasound is nondiagnostic, I will consider CT.  The patient declines any pain medication at this time.  ----------------------------------------- 6:53 AM on 02/22/2018 -----------------------------------------  Lab work-up is unremarkable.  The patient appears more comfortable.  She is pending pelvic ultrasound.  I am sending the patient out to the oncoming physician Dr. Darnelle CatalanMalinda. ____________________________________________   FINAL CLINICAL IMPRESSION(S) / ED DIAGNOSES  Final diagnoses:  Pelvic pain      NEW MEDICATIONS STARTED DURING THIS VISIT:  New Prescriptions   No medications on file     Note:  This document was prepared using Dragon voice recognition software  and may include unintentional dictation errors.    Dionne BucySiadecki, Dhanvi Boesen, MD 02/22/18 [phone]

## 2018-02-22 NOTE — ED Provider Notes (Signed)
Recheck one more time before patient goes patient's abdominal pain is a lot better she is able to move without any pain she does have a little bit of residual pain on exam to palpation only again with her normal blood work and her reluctance to do anything else we will let her go she does promise to return at once if she is worse.   Arnaldo NatalMalinda, Paul F, MD 02/22/18 [phone]

## 2018-02-22 NOTE — ED Notes (Signed)
Patient transported to ultrasound.

## 2018-02-22 NOTE — Discharge Instructions (Addendum)
Please return for worse pain fever vomiting or feeling sicker.  You can try for the over-the-counter Motrin 3 times a day with food for couple days.  That should help.  Please do be sure to return if you worsen or not completely better in the next day or so..Jacqueline Villarreal

## 2018-02-24 LAB — RPR: RPR Ser Ql: NONREACTIVE

## 2018-05-01 ENCOUNTER — Emergency Department
Admission: EM | Admit: 2018-05-01 | Discharge: 2018-05-01 | Disposition: A | Discharge status: Home / Self Care | Payer: Self-pay | Attending: Emergency Medicine | Admitting: Emergency Medicine

## 2018-05-01 ENCOUNTER — Encounter: Payer: Self-pay | Admitting: Emergency Medicine

## 2018-05-01 DIAGNOSIS — R0989 Other specified symptoms and signs involving the circulatory and respiratory systems: Secondary | ICD-10-CM | POA: Insufficient documentation

## 2018-05-01 DIAGNOSIS — F17290 Nicotine dependence, other tobacco product, uncomplicated: Secondary | ICD-10-CM | POA: Insufficient documentation

## 2018-05-01 MED ORDER — OMEPRAZOLE 2 MG/ML ORAL SUSPENSION: 20.0000 mg | Freq: Every day | ORAL | 0 refills | Status: DC

## 2018-05-01 MED ORDER — PANTOPRAZOLE SODIUM 40 MG PO PACK
40.0000 mg | PACK | Freq: Every day | ORAL | Status: DC
  Administered 2018-05-01: 40 mg
  Filled 2018-05-01: qty 20

## 2018-05-01 MED ORDER — LIDOCAINE VISCOUS HCL 2 % MT SOLN
15.0000 mL | Freq: Once | OROMUCOSAL | Status: AC
  Administered 2018-05-01: 15 mL via OROMUCOSAL
  Filled 2018-05-01: qty 15

## 2018-05-01 NOTE — ED Triage Notes (Signed)
Patient states that since last night that her throat just doesn't feel right. Patient also states that her eyes have been watering.

## 2018-05-01 NOTE — ED Notes (Signed)
Patient  Reports throat feels tight  Started last pm  Patient appears  In no acute distress  Speaking in complete sentances

## 2018-05-01 NOTE — ED Provider Notes (Signed)
[facility] Emergency Department Provider Note  ____________________________________________  Time seen: Approximately 10:31 PM  I have reviewed the triage vital signs and the nursing notes.   HISTORY  Chief Complaint Sore Throat    HPI Jacqueline Villarreal is a 22 y.o. female presents emergency department for evaluation of a strange feeling to her throat that started last night.  Patient states that she was eating a chicken noodle soup last night and afterwards her foot throat started to feel strange. She is unable to swallow pills normally and states that this seems like a similar sensation.  She states that her boyfriend had an upset stomach later so she is not sure if there was something wrong with the soup.  Patient has anxiety and states this may be contributing.  She states that sometimes when she was swallowing today, she still felt like there was something in her throat.  Patient states that this happened once when she was a child and her parents were concerned that she had an eating disorder.  Patient denies any anxiety toward food.  No shortness of breath, swelling.  History reviewed. No pertinent past medical history.  There are no active problems to display for this patient.   Past Surgical History:  Procedure Laterality Date  . MOUTH SURGERY      Prior to Admission medications   Medication Sig Start Date End Date Taking? Authorizing Provider  meloxicam (MOBIC) 15 MG tablet Take 1 tablet (15 mg total) by mouth daily. Patient not taking: Reported on 02/22/2018 10/20/17 10/20/18  Tommi Rumps, PA-C  omeprazole (PRILOSEC) 2 mg/mL SUSP Take 10 mLs (20 mg total) by mouth daily. 05/01/18   Enid Derry, PA-C    Allergies Patient has no known allergies.  No family history on file.  Social History Social History   Tobacco Use  . Smoking status: Current Every Day Smoker    Types: E-cigarettes  . Smokeless tobacco: Never Used  Substance Use  Topics  . Alcohol use: Not Currently    Frequency: Never  . Drug use: No     Review of Systems  Constitutional: No fever/chills Cardiovascular: No chest pain. Respiratory: No SOB. Gastrointestinal: No abdominal pain.  No nausea, no vomiting.  Musculoskeletal: Negative for musculoskeletal pain. Skin: Negative for rash, abrasions, lacerations, ecchymosis. Neurological: Negative for headaches, numbness or tingling   ____________________________________________   PHYSICAL EXAM:  VITAL SIGNS: ED Triage Vitals [05/01/18 2003]  Enc Vitals Group     BP 136/75     Pulse Rate (!) 58     Resp 18     Temp 98.5 F (36.9 C)     Temp Source Oral     SpO2 100 %     Weight 105 lb (47.6 kg)     Height 5\' 4"  (1.626 m)     Head Circumference      Peak Flow      Pain Score 0     Pain Loc      Pain Edu?      Excl. in GC?      Constitutional: Alert and oriented. Well appearing and in no acute distress. Eyes: Conjunctivae are normal. PERRL. EOMI. Head: Atraumatic. ENT:      Ears:      Nose: No congestion/rhinnorhea.      Mouth/Throat: Mucous membranes are moist.  Oropharynx nonerythematous.  Tonsils not enlarged.  Uvula midline. Neck: No stridor.  No cervical lymphadenopathy. Cardiovascular: Normal rate, regular rhythm.  Good peripheral  circulation. Respiratory: Normal respiratory effort without tachypnea or retractions. Lungs CTAB. Good air entry to the bases with no decreased or absent breath sounds. Musculoskeletal: Full range of motion to all extremities. No gross deformities appreciated. Neurologic:  Normal speech and language. No gross focal neurologic deficits are appreciated.  Skin:  Skin is warm, dry and intact. No rash noted. Psychiatric: Mood and affect are normal. Speech and behavior are normal. Patient exhibits appropriate insight and judgement.   ____________________________________________   LABS (all labs ordered are listed, but only abnormal results are  displayed)  Labs Reviewed - No data to display ____________________________________________  EKG   ____________________________________________  RADIOLOGY   No results found.  ____________________________________________    PROCEDURES  Procedure(s) performed:    Procedures    Medications  pantoprazole sodium (PROTONIX) 40 mg/20 mL oral suspension 40 mg (40 mg Per Tube Given 05/01/18 2131)  lidocaine (XYLOCAINE) 2 % viscous mouth solution 15 mL (15 mLs Mouth/Throat Given 05/01/18 2132)     ____________________________________________   INITIAL IMPRESSION / ASSESSMENT AND PLAN / ED COURSE  Pertinent labs & imaging results that were available during my care of the patient were reviewed by me and considered in my medical decision making (see chart for details).  Review of the [place] CSRS was performed in accordance of the [facility] prior to dispensing any controlled drugs.   Patient presented to the emergency department for evaluation of globus sensation.  Vital signs and exam are reassuring.  Patient was given lidocaine and Protonix in the emergency department with improvement of symptoms.  Patient is drinking orange juice and applesauce.  I suspect that anxiety is contributing.  Patient will be discharged home with prescriptions for omeprazole. Patient is to follow up with ENT as directed. Patient is given ED precautions to return to the ED for any worsening or new symptoms.     ____________________________________________  FINAL CLINICAL IMPRESSION(S) / ED DIAGNOSES  Final diagnoses:  Globus sensation      NEW MEDICATIONS STARTED DURING THIS VISIT:  ED Discharge Orders         Ordered    omeprazole (PRILOSEC) 2 mg/mL SUSP  Daily     05/01/18 2233              This chart was dictated using voice recognition software/Dragon. Despite best efforts to proofread, errors can occur which can change the meaning. Any change was purely unintentional.    Enid Derry, PA-C 05/01/18 2301    Emily Filbert, MD 05/06/18 1356

## 2018-07-29 ENCOUNTER — Other Ambulatory Visit: Payer: Self-pay

## 2018-07-29 ENCOUNTER — Emergency Department
Admission: EM | Admit: 2018-07-29 | Discharge: 2018-07-29 | Disposition: A | Discharge status: Home / Self Care | Payer: Self-pay | Attending: Emergency Medicine | Admitting: Emergency Medicine

## 2018-07-29 ENCOUNTER — Emergency Department: Payer: Self-pay

## 2018-07-29 DIAGNOSIS — F17290 Nicotine dependence, other tobacco product, uncomplicated: Secondary | ICD-10-CM | POA: Insufficient documentation

## 2018-07-29 DIAGNOSIS — J3489 Other specified disorders of nose and nasal sinuses: Secondary | ICD-10-CM | POA: Insufficient documentation

## 2018-07-29 DIAGNOSIS — J069 Acute upper respiratory infection, unspecified: Secondary | ICD-10-CM | POA: Insufficient documentation

## 2018-07-29 DIAGNOSIS — Z79899 Other long term (current) drug therapy: Secondary | ICD-10-CM | POA: Insufficient documentation

## 2018-07-29 DIAGNOSIS — B9789 Other viral agents as the cause of diseases classified elsewhere: Secondary | ICD-10-CM | POA: Insufficient documentation

## 2018-07-29 MED ORDER — PSEUDOEPH-BROMPHEN-DM 30-2-10 MG/5ML PO SYRP: 5.0000 mL | ORAL_SOLUTION | Freq: Four times a day (QID) | ORAL | 0 refills | Status: DC | PRN

## 2018-07-29 MED ORDER — PREDNISONE 10 MG PO TABS: ORAL_TABLET | ORAL | 0 refills | Status: DC

## 2018-07-29 NOTE — ED Triage Notes (Signed)
Cough congestion and runny nose X 1 month. Pt alert and oriented X4, active, cooperative, pt in NAD. RR even and unlabored, color WNL.

## 2018-07-29 NOTE — ED Provider Notes (Signed)
[facility] Emergency Department Provider Note  ____________________________________________   First MD Initiated Contact with Patient 07/29/18 [phone]     (approximate)  I have reviewed the triage vital signs and the nursing notes.   HISTORY  Chief Complaint Cough and Nasal Congestion   HPI Jacqueline Villarreal is a 23 y.o. female presents to the ED with complaint of congested cough and rhinorrhea for the last month.  Patient states that she has been taking Mucinex without any relief.  She states that she "may have had a fever last week".  She has not seen anybody prior to this visit for her symptoms.  Patient states she "vapes all day".  Patient has a history of bronchitis.   History reviewed. No pertinent past medical history.  There are no active problems to display for this patient.   Past Surgical History:  Procedure Laterality Date  . MOUTH SURGERY      Prior to Admission medications   Medication Sig Start Date End Date Taking? Authorizing Provider  brompheniramine-pseudoephedrine-DM 30-2-10 MG/5ML syrup Take 5 mLs by mouth 4 (four) times daily as needed. 07/29/18   Tommi Rumps, PA-C  omeprazole (PRILOSEC) 2 mg/mL SUSP Take 10 mLs (20 mg total) by mouth daily. 05/01/18   Enid Derry, PA-C  predniSONE (DELTASONE) 10 MG tablet Take 3 tablets once a day for 4 days 07/29/18   Tommi Rumps, PA-C    Allergies Patient has no known allergies.  No family history on file.  Social History Social History   Tobacco Use  . Smoking status: Current Every Day Smoker    Types: E-cigarettes  . Smokeless tobacco: Never Used  Substance Use Topics  . Alcohol use: Not Currently    Frequency: Never  . Drug use: No    Review of Systems Constitutional: No fever/chills Eyes: No visual changes. ENT: No sore throat.  Positive for nasal congestion. Cardiovascular: Denies chest pain. Respiratory: Denies shortness of breath.  Positive for  cough. Gastrointestinal:  No nausea, no vomiting. Musculoskeletal: Negative for back pain. Skin: Negative for rash. Neurological: Negative for headaches, focal weakness or numbness. ___________________________________________   PHYSICAL EXAM:  VITAL SIGNS: ED Triage Vitals [07/29/18 0933]  Enc Vitals Group     BP 119/68     Pulse Rate 72     Resp 18     Temp 98 F (36.7 C)     Temp Source Oral     SpO2 100 %     Weight 105 lb (47.6 kg)     Height 5\' 4"  (1.626 m)     Head Circumference      Peak Flow      Pain Score 0     Pain Loc      Pain Edu?      Excl. in GC?    Constitutional: Alert and oriented. Well appearing and in no acute distress. Eyes: Conjunctivae are normal.  Head: Atraumatic. Nose: Minimal congestion/rhinnorhea.  EACs and TMs are clear bilaterally. Mouth/Throat: Mucous membranes are moist.  Oropharynx non-erythematous.  Normal posterior drainage seen. Neck: No stridor.   Hematological/Lymphatic/Immunilogical: No cervical lymphadenopathy. Cardiovascular: Normal rate, regular rhythm. Grossly normal heart sounds.  Good peripheral circulation. Respiratory: Normal respiratory effort.  No retractions. Lungs CTAB. Gastrointestinal: Soft and nontender. No distention.  Musculoskeletal: No lower extremity tenderness nor edema.  No joint effusions. Neurologic:  Normal speech and language. No gross focal neurologic deficits are appreciated. No gait instability. Skin:  Skin is warm, dry  and intact. No rash noted. Psychiatric: Mood and affect are normal. Speech and behavior are normal.  ____________________________________________   LABS (all labs ordered are listed, but only abnormal results are displayed)  Labs Reviewed - No data to display  RADIOLOGY  ED MD interpretation:   Chest x-ray shows no infiltrate.  Official radiology report(s): Dg Chest 2 View  Result Date: 07/29/2018 CLINICAL DATA:  Productive cough, congestion, runny nose x1 month. No known  cardiopulmonary diseases. Pt vapes and smokes everyday. EXAM: CHEST - 2 VIEW COMPARISON:  None. FINDINGS: The heart size and mediastinal contours are within normal limits. Both lungs are clear. No pleural effusion or pneumothorax the visualized skeletal structures are unremarkable. IMPRESSION: No active cardiopulmonary disease. Electronically Signed   By: Amie Portland M.D.   On: 07/29/2018 10:41    ____________________________________________   PROCEDURES  Procedure(s) performed: None  Procedures  Critical Care performed: No  ____________________________________________   INITIAL IMPRESSION / ASSESSMENT AND PLAN / ED COURSE  As part of my medical decision making, I reviewed the following data within the electronic MEDICAL RECORD NUMBER Notes from prior ED visits and [place] Controlled Substance Database  Patient presents to the ED with complaint of URI symptoms for 1 month.  Patient states that she has been taking Mucinex without any relief.  She states she may have had a fever last week but this is subjective.  Patient reports that she vapes all day.  She has a history of bronchitis in the past.  Physical exam was unremarkable.  Chest x-ray is negative for infiltrate or acute cardiopulmonary disease.  Patient was discharged with prescription for prednisone and Bromfed-DM.  She is to follow-up with [facility] acute care or urgent care of her choice.  She is also encouraged to discontinue vaping and information about vaping was given to her.  ____________________________________________   FINAL CLINICAL IMPRESSION(S) / ED DIAGNOSES  Final diagnoses:  Viral URI with cough     ED Discharge Orders         Ordered    brompheniramine-pseudoephedrine-DM 30-2-10 MG/5ML syrup  4 times daily PRN     07/29/18 1100    predniSONE (DELTASONE) 10 MG tablet     07/29/18 1100           Note:  This document was prepared using Dragon voice recognition software and may include unintentional  dictation errors.    Tommi Rumps, PA-C 07/29/18 1133    Jene Every, MD 07/29/18 1213

## 2018-07-29 NOTE — Discharge Instructions (Signed)
Follow-up with no clinic acute care or any urgent care of your choice if any continued problems.  Begin taking prednisone 3 tablets once a day for the next 4 days.  Bromfed-DM is every 6 hours as needed for cough and congestion.  Increase fluids.  Decrease or discontinue vaping if at all possible.  Read information about vaping.

## 2019-03-31 ENCOUNTER — Ambulatory Visit: Payer: Self-pay

## 2019-03-31 ENCOUNTER — Other Ambulatory Visit: Payer: Self-pay

## 2019-04-03 ENCOUNTER — Ambulatory Visit: Payer: Self-pay

## 2019-10-24 ENCOUNTER — Encounter: Payer: Self-pay | Admitting: Emergency Medicine

## 2019-10-24 ENCOUNTER — Other Ambulatory Visit: Payer: Self-pay

## 2019-10-24 ENCOUNTER — Emergency Department
Admission: EM | Admit: 2019-10-24 | Discharge: 2019-10-24 | Disposition: A | Discharge status: Home / Self Care | Payer: Self-pay | Attending: Emergency Medicine | Admitting: Emergency Medicine

## 2019-10-24 DIAGNOSIS — Z79899 Other long term (current) drug therapy: Secondary | ICD-10-CM | POA: Insufficient documentation

## 2019-10-24 DIAGNOSIS — R109 Unspecified abdominal pain: Secondary | ICD-10-CM | POA: Insufficient documentation

## 2019-10-24 DIAGNOSIS — F17290 Nicotine dependence, other tobacco product, uncomplicated: Secondary | ICD-10-CM | POA: Insufficient documentation

## 2019-10-24 DIAGNOSIS — R42 Dizziness and giddiness: Secondary | ICD-10-CM | POA: Insufficient documentation

## 2019-10-24 LAB — URINALYSIS, COMPLETE (UACMP) WITH MICROSCOPIC
Bacteria, UA: NONE SEEN
Bilirubin Urine: NEGATIVE
Glucose, UA: NEGATIVE mg/dL
Hgb urine dipstick: NEGATIVE
Ketones, ur: NEGATIVE mg/dL
Nitrite: NEGATIVE
Protein, ur: NEGATIVE mg/dL
Specific Gravity, Urine: 1.006 (ref 1.005–1.030)
pH: 8 (ref 5.0–8.0)

## 2019-10-24 LAB — CBC
HCT: 40.9 % (ref 36.0–46.0)
Hemoglobin: 13.6 g/dL (ref 12.0–15.0)
MCH: 29.4 pg (ref 26.0–34.0)
MCHC: 33.3 g/dL (ref 30.0–36.0)
MCV: 88.3 fL (ref 80.0–100.0)
Platelets: 201 10*3/uL (ref 150–400)
RBC: 4.63 MIL/uL (ref 3.87–5.11)
RDW: 11.9 % (ref 11.5–15.5)
WBC: 8.6 10*3/uL (ref 4.0–10.5)
nRBC: 0 % (ref 0.0–0.2)

## 2019-10-24 LAB — BASIC METABOLIC PANEL
Anion gap: 8 (ref 5–15)
BUN: 11 mg/dL (ref 6–20)
CO2: 25 mmol/L (ref 22–32)
Calcium: 9.5 mg/dL (ref 8.9–10.3)
Chloride: 106 mmol/L (ref 98–111)
Creatinine, Ser: 0.53 mg/dL (ref 0.44–1.00)
GFR calc Af Amer: >60 mL/min (ref >60)
GFR calc non Af Amer: >60 mL/min (ref >60)
Glucose, Bld: 83 mg/dL (ref 70–99)
Potassium: 3.7 mmol/L (ref 3.5–5.1)
Sodium: 139 mmol/L (ref 135–145)

## 2019-10-24 LAB — POCT PREGNANCY, URINE: Preg Test, Ur: NEGATIVE

## 2019-10-24 MED ORDER — NAPROXEN 500 MG PO TABS: 500.0000 mg | ORAL_TABLET | Freq: Two times a day (BID) | ORAL | 2 refills | Status: DC

## 2019-10-24 NOTE — ED Triage Notes (Signed)
Patient presents to the ED with right flank pain x 1 hour.  Patient states pain is very sharp.  Patient denies any urinary symptoms.  Patient states a few hours ago, she felt light headed and got dizzy but recovered quickly.  Patient states, "I think my sugar dropped, it does that a lot."  Patient is alert and oriented x 4.  Patient is ambulatory with a steady gait.

## 2019-10-24 NOTE — ED Provider Notes (Signed)
[facility] Emergency Department Provider Note   ____________________________________________    I have reviewed the triage vital signs and the nursing notes.   HISTORY  Chief Complaint Right flank pain    HPI Jacqueline Villarreal is a 24 y.o. female who presents with complaints of right flank pain.  Patient reports that she had aching pain in her right flank at work today which led to her present to the emergency department.  Now she is feeling better.  She did not take anything for this.  She denies vaginal discharge or vaginal bleeding.  No hematuria or dysuria.  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  No further dizziness.  She thinks this may have been related to her blood sugar.  No nausea or vomiting  Past Medical History:  Diagnosis Date  . Ovarian cyst     There are no problems to display for this patient.   Past Surgical History:  Procedure Laterality Date  . MOUTH SURGERY      Prior to Admission medications   Medication Sig Start Date End Date Taking? Authorizing Provider  brompheniramine-pseudoephedrine-DM 30-2-10 MG/5ML syrup Take 5 mLs by mouth 4 (four) times daily as needed. 07/29/18   Tommi Rumps, PA-C  naproxen (NAPROSYN) 500 MG tablet Take 1 tablet (500 mg total) by mouth 2 (two) times daily with a meal. 10/24/19   Jene Every, MD  omeprazole (PRILOSEC) 2 mg/mL SUSP Take 10 mLs (20 mg total) by mouth daily. 05/01/18   Enid Derry, PA-C  predniSONE (DELTASONE) 10 MG tablet Take 3 tablets once a day for 4 days 07/29/18   Tommi Rumps, PA-C     Allergies Patient has no known allergies.  Family History  Problem Relation Age of Onset  . Cancer Paternal Grandmother   . Irritable bowel syndrome Paternal Grandmother     Social History Social History   Tobacco Use  . Smoking status: Current Every Day Smoker    Types: E-cigarettes  . Smokeless tobacco: Never Used  . Tobacco comment: "I vape every  day all day."   Substance Use Topics  . Alcohol use: Yes    Comment: rarely  . Drug use: No    Review of Systems  Constitutional: No fever/chills Eyes: No visual changes.  ENT: No sore throat. Cardiovascular: Denies chest pain. Respiratory: Denies shortness of breath. Gastrointestinal: As above Genitourinary: Negative for dysuria. Musculoskeletal: Negative for back pain. Skin: Negative for rash. Neurological: Negative for headaches    ____________________________________________   PHYSICAL EXAM:  VITAL SIGNS: ED Triage Vitals  Enc Vitals Group     BP 10/24/19 1700 140/77     Pulse Rate 10/24/19 1700 83     Resp 10/24/19 1700 16     Temp 10/24/19 1700 98.9 F (37.2 C)     Temp Source 10/24/19 1700 Oral     SpO2 10/24/19 1700 100 %     Weight 10/24/19 1702 49.7 kg (109 lb 9.1 oz)     Height 10/24/19 1702 1.626 m (5\' 4" )     Head Circumference --      Peak Flow --      Pain Score 10/24/19 1702 6     Pain Loc --      Pain Edu? --      Excl. in GC? --     Constitutional: Alert and oriented. No acute distress.   Nose: No congestion/rhinnorhea. Mouth/Throat: Mucous membranes are moist.    Cardiovascular: Normal  rate, regular rhythm. Grossly normal heart sounds.  Good peripheral circulation. Respiratory: Normal respiratory effort.  No retractions. Lungs CTAB. Gastrointestinal: Soft and nontender. No distention.  No CVA tenderness.  Reassuring exam Genitourinary: deferred Musculoskeletal:   Warm and well perfused Neurologic:  Normal speech and language. No gross focal neurologic deficits are appreciated.  Skin:  Skin is warm, dry and intact. No rash noted. Psychiatric: Mood and affect are normal. Speech and behavior are normal.  ____________________________________________   LABS (all labs ordered are listed, but only abnormal results are displayed)  Labs Reviewed  URINALYSIS, COMPLETE (UACMP) WITH MICROSCOPIC - Abnormal; Notable for the following components:        Result Value   Color, Urine STRAW (*)    APPearance CLEAR (*)    Leukocytes,Ua TRACE (*)    All other components within normal limits  BASIC METABOLIC PANEL  CBC  POC URINE PREG, ED  POCT PREGNANCY, URINE   ____________________________________________  EKG  None ____________________________________________  RADIOLOGY  None ____________________________________________   PROCEDURES  Procedure(s) performed: No  Procedures   Critical Care performed: No ____________________________________________   INITIAL IMPRESSION / ASSESSMENT AND PLAN / ED COURSE  Pertinent labs & imaging results that were available during my care of the patient were reviewed by me and considered in my medical decision making (see chart for details).  Patient well-appearing and in no acute distress, right flank pain differential includes ureterolithiasis, Pilo, musculoskeletal pain.  Urinalysis negative for hemoglobin or urinary tract infection.  Symptoms have resolved without intervention.  Lab work otherwise unremarkable.  Recommend supportive care outpatient follow-up, return precautions discussed    ____________________________________________   FINAL CLINICAL IMPRESSION(S) / ED DIAGNOSES  Final diagnoses:  Flank pain        Note:  This document was prepared using Dragon voice recognition software and may include unintentional dictation errors.   Lavonia Drafts, MD 10/24/19 1924

## 2019-11-09 ENCOUNTER — Encounter: Payer: Self-pay | Admitting: Physician Assistant

## 2019-11-09 ENCOUNTER — Other Ambulatory Visit: Payer: Self-pay

## 2019-11-09 ENCOUNTER — Ambulatory Visit: Payer: Self-pay | Admitting: Physician Assistant

## 2019-11-09 DIAGNOSIS — Z113 Encounter for screening for infections with a predominantly sexual mode of transmission: Secondary | ICD-10-CM

## 2019-11-09 LAB — WET PREP FOR TRICH, YEAST, CLUE
Trichomonas Exam: NEGATIVE
Yeast Exam: NEGATIVE

## 2019-11-09 NOTE — Progress Notes (Signed)
[facility] Department STI clinic/screening visit  Subjective:  Jacqueline Villarreal is a 24 y.o. female being seen today for an STI screening visit. The patient reports they do have symptoms.  Patient reports that they do not desire a pregnancy in the next year.   They reported they are not interested in discussing contraception today.  No LMP recorded.   Patient has the following medical conditions:  There are no problems to display for this patient.   Chief Complaint  Patient presents with  . SEXUALLY TRANSMITTED DISEASE    screening    HPI  Patient reports that she has noticed that she has an increased amount of discharge.  Also, that her urine "feels really warm" but denies dysuria.  Denies other symptoms and chronic conditions.  Reports taking vitamin B12.  LMP 11/04/2019 and normal.  Using abstinence as BCM.  See flowsheet for further details and programmatic requirements.    The following portions of the patient's history were reviewed and updated as appropriate: allergies, current medications, past medical history, past social history, past surgical history and problem list.  Objective:  There were no vitals filed for this visit.  Physical Exam Constitutional:      General: She is not in acute distress.    Appearance: Normal appearance. She is normal weight.  HENT:     Head: Normocephalic and atraumatic.     Comments: No nits, lice, or hair loss. No cervical, supraclavicular or axillary adenopathy.    Mouth/Throat:     Mouth: Mucous membranes are moist.     Pharynx: Oropharynx is clear. No oropharyngeal exudate or posterior oropharyngeal erythema.  Eyes:     Conjunctiva/sclera: Conjunctivae normal.  Pulmonary:     Effort: Pulmonary effort is normal.  Abdominal:     Palpations: Abdomen is soft. There is no mass.     Tenderness: There is no abdominal tenderness. There is no guarding or rebound.  Genitourinary:    General: Normal vulva.     Rectum: Normal.      Comments: External genitalia/pubic area without nits, lice, edema, erythema, and inguinal adenopathy. Vagina with normal mucosa, small amount of slightly brownish discharge due to menses. Cervix without visible lesions. Uterus firm, mobile, nt, no masses, no CMT, no adnexal tenderness or fullness. Musculoskeletal:     Cervical back: Neck supple. No tenderness.  Skin:    General: Skin is warm and dry.     Findings: No bruising, erythema, lesion or rash.  Neurological:     Mental Status: She is alert and oriented to person, place, and time.  Psychiatric:        Mood and Affect: Mood normal.        Behavior: Behavior normal.        Thought Content: Thought content normal.        Judgment: Judgment normal.      Assessment and Plan:  Jacqueline Villarreal is a 24 y.o. female presenting to the [facility] Department for STI screening  1. Screening for STD (sexually transmitted disease) Patient into clinic with concerns.  Declines blood work today. Rec condoms with all sex. Await test results. Counseled that RN will call if needs to RTC for treatment once results are back.  Reassured/couseled re:  Normal vs abnormal discharge and that amount varies related to cycle. - WET PREP FOR TRICH, YEAST, CLUE - Chlamydia/Gonorrhea [facility]     No follow-ups on file.  No future appointments.  Jacqueline Villarreal  [place], [place]

## 2019-11-16 ENCOUNTER — Ambulatory Visit: Payer: Self-pay

## 2019-11-16 ENCOUNTER — Telehealth: Payer: Self-pay

## 2019-11-16 ENCOUNTER — Other Ambulatory Visit: Payer: Self-pay

## 2019-11-16 DIAGNOSIS — A749 Chlamydial infection, unspecified: Secondary | ICD-10-CM

## 2019-11-16 MED ORDER — AZITHROMYCIN 500 MG PO TABS
1000.0000 mg | ORAL_TABLET | Freq: Once | ORAL | Status: AC
  Administered 2019-11-16: 1000 mg via ORAL

## 2019-11-16 NOTE — Telephone Encounter (Signed)
TC to patient. Verified ID via password/SS#. Informed of positive chlamydia and need for tx.  Reports no longer with partner and has probably been a year since she has been with anyone.  Appt scheduled for tx.  Instructed to eat before appt.  Patient states can't swallow pills.  Instructed patient to bring yogurt or applesauce to mix crushed pills in. Verbalized understanding.Richmond Campbell, RN

## 2019-11-16 NOTE — Progress Notes (Signed)
Client requested pills be crushed and then she diluted with water from her bottle as she forgot her yogurt. Per client, has health care anxiety and takes her a long time to swallow pills. Took ~ 30 minutes for her to take Azithromycin at appt today. Client declined partner cards stating last saw 2 partners a year ago. Encouraged to contact them as they need treatment.  Jossie Ng, RN

## 2019-11-17 ENCOUNTER — Telehealth: Payer: Self-pay | Admitting: Family Medicine

## 2019-11-17 NOTE — Telephone Encounter (Signed)
Pt. has questions about after taking meds,she has heavy discharge.

## 2019-11-17 NOTE — Telephone Encounter (Signed)
Phone received from pt. Pt counseled that heavy discharge is not typically a common side effect of antibiotics, normally upset stomach and diarrhea are common, but vaginal dishcharge is not an allergic reaction. Sometimes folks may develop yeast infections after being on antibiotics and she can come back for screening if she feels like this has developed (no complaints of itching or irritation). No other complaints.

## 2019-11-17 NOTE — Telephone Encounter (Signed)
Phone call to pt. Left message on voicemail that RN with [facility] is returning phone call, if still need assistance, please call [phone].

## 2019-11-17 NOTE — Telephone Encounter (Signed)
Phone note continued: Pt counseled that her discharge can vary at times during her cycle, and it is possible that discharge may be coming from the infection. Pt confirmed she took her antibiotics yesterday and did not vomit up the medication.  The antibiotics will be working for several days to treat infection, no sex for 7 days with or without a condom (anal, oral, vaginal) from time of treatment. We do advise that she come back in approx 3 months for TOC and can come back earlier if she has any problems or concerns.

## 2019-12-02 ENCOUNTER — Other Ambulatory Visit: Payer: Self-pay

## 2019-12-02 ENCOUNTER — Emergency Department
Admission: EM | Admit: 2019-12-02 | Discharge: 2019-12-02 | Disposition: A | Discharge status: Home / Self Care | Payer: Self-pay | Attending: Emergency Medicine | Admitting: Emergency Medicine

## 2019-12-02 DIAGNOSIS — N3001 Acute cystitis with hematuria: Secondary | ICD-10-CM

## 2019-12-02 DIAGNOSIS — F17290 Nicotine dependence, other tobacco product, uncomplicated: Secondary | ICD-10-CM | POA: Insufficient documentation

## 2019-12-02 LAB — URINALYSIS, COMPLETE (UACMP) WITH MICROSCOPIC
Bacteria, UA: NONE SEEN
Bilirubin Urine: NEGATIVE
Glucose, UA: NEGATIVE mg/dL
Ketones, ur: 20 mg/dL — AB
Nitrite: NEGATIVE
Protein, ur: 100 mg/dL — AB
RBC / HPF: >50 RBC/hpf — ABNORMAL HIGH (ref 0–5)
Specific Gravity, Urine: 1.024 (ref 1.005–1.030)
WBC, UA: >50 WBC/hpf — ABNORMAL HIGH (ref 0–5)
pH: 5 (ref 5.0–8.0)

## 2019-12-02 LAB — WET PREP, GENITAL
Clue Cells Wet Prep HPF POC: NONE SEEN
Sperm: NONE SEEN
Trich, Wet Prep: NONE SEEN
Yeast Wet Prep HPF POC: NONE SEEN

## 2019-12-02 LAB — CHLAMYDIA/NGC RT PCR (ARMC ONLY)
Chlamydia Tr: NOT DETECTED
N gonorrhoeae: NOT DETECTED

## 2019-12-02 LAB — POCT PREGNANCY, URINE: Preg Test, Ur: NEGATIVE

## 2019-12-02 MED ORDER — SULFAMETHOXAZOLE-TRIMETHOPRIM 800-160 MG PO TABS: 1.0000 | ORAL_TABLET | Freq: Two times a day (BID) | ORAL | 0 refills | Status: DC

## 2019-12-02 NOTE — Discharge Instructions (Addendum)
Follow-up with the health department if you continue to have any vaginal issues however today it appears that you have a urinary tract infection is being treated with Bactrim DS.  The medication is 1 tablet twice a day for the next 10 days.  You will need to take the entire prescription.  Increase fluids.  You may take Tylenol or ibuprofen with this medication if needed for discomfort.  Also the results of your other test can be seen on my chart.  If any other tests come back positive you will get a phone call.

## 2019-12-02 NOTE — ED Notes (Signed)
Patient was diagnosed with chlamidya 2 weeks ago and treated. Patient states she waited over a week before having sex with a new partner and used condoms.

## 2019-12-02 NOTE — ED Provider Notes (Signed)
[facility] Emergency Department Provider Note   ____________________________________________   First MD Initiated Contact with Patient 12/02/19 1246     (approximate)  I have reviewed the triage vital signs and the nursing notes.   HISTORY  Chief Complaint Dysuria   HPI Jacqueline Villarreal is a 24 y.o. female presents to the ED with complaint of dysuria.  Patient states that she was diagnosed with chlamydia 2 weeks ago and was treated.  She states that she waited over a week before having sex with a new partner.  She was [facility] the [facility] with Zithromax.  She denies any unusual vaginal discharge.  She denies any fever, chills, nausea or vomiting.  No back pain.  She states that she has had urinary tract infections in the past.  Patient rates her pain as an 8 out of 10.      Past Medical History:  Diagnosis Date  . Ovarian cyst     There are no problems to display for this patient.   Past Surgical History:  Procedure Laterality Date  . MOUTH SURGERY      Prior to Admission medications   Medication Sig Start Date End Date Taking? Authorizing Provider  sulfamethoxazole-trimethoprim (BACTRIM DS) 800-160 MG tablet Take 1 tablet by mouth 2 (two) times daily. 12/02/19   Tommi Rumps, PA-C    Allergies Patient has no known allergies.  Family History  Problem Relation Age of Onset  . Cancer Paternal Grandmother   . Irritable bowel syndrome Paternal Grandmother     Social History Social History   Tobacco Use  . Smoking status: Current Every Day Smoker    Types: E-cigarettes  . Smokeless tobacco: Never Used  . Tobacco comment: "I vape every day all day."   Substance Use Topics  . Alcohol use: Yes    Comment: rarely  . Drug use: No    Review of Systems Constitutional: No fever/chills Eyes: No visual changes. ENT: No sore throat.  Cardiovascular: Denies chest pain. Respiratory: Denies shortness of  breath. Gastrointestinal: No abdominal pain.  No nausea, no vomiting.  Genitourinary: Positive for dysuria. Musculoskeletal: Negative for muscle aches. Skin: Negative for rash. Neurological: Negative for headaches, focal weakness or numbness.   ____________________________________________   PHYSICAL EXAM:  VITAL SIGNS: ED Triage Vitals  Enc Vitals Group     BP 12/02/19 1220 126/72     Pulse Rate 12/02/19 1220 90     Resp 12/02/19 1220 18     Temp 12/02/19 1220 98 F (36.7 C)     Temp Source 12/02/19 1220 Oral     SpO2 12/02/19 1220 100 %     Weight 12/02/19 1218 109 lb (49.4 kg)     Height 12/02/19 1218 5\' 4"  (1.626 m)     Head Circumference --      Peak Flow --      Pain Score 12/02/19 1218 8     Pain Loc --      Pain Edu? --      Excl. in GC? --     Constitutional: Alert and oriented. Well appearing and in no acute distress. Eyes: Conjunctivae are normal.  Head: Atraumatic. Neck: No stridor.   Cardiovascular: Normal rate, regular rhythm. Grossly normal heart sounds.  Good peripheral circulation. Respiratory: Normal respiratory effort.  No retractions. Lungs CTAB. Gastrointestinal: Soft and nontender. No distention.  Genitourinary: External genitalia is unremarkable.  There is some thin white secretions noted along the vaginal cuff.  No adnexal masses or tenderness is noted.  No cervical motion tenderness present. Musculoskeletal: Moves upper and lower extremities any difficulty.  Normal gait was noted. Neurologic:  Normal speech and language. No gross focal neurologic deficits are appreciated. Skin:  Skin is warm, dry and intact. No rash noted. Psychiatric: Mood and affect are normal. Speech and behavior are normal.  ____________________________________________   LABS (all labs ordered are listed, but only abnormal results are displayed)  Labs Reviewed  WET PREP, GENITAL - Abnormal; Notable for the following components:      Result Value   WBC, Wet Prep HPF POC  MODERATE (*)    All other components within normal limits  URINALYSIS, COMPLETE (UACMP) WITH MICROSCOPIC - Abnormal; Notable for the following components:   Color, Urine YELLOW (*)    APPearance HAZY (*)    Hgb urine dipstick LARGE (*)    Ketones, ur 20 (*)    Protein, ur 100 (*)    Leukocytes,Ua SMALL (*)    RBC / HPF >50 (*)    WBC, UA >50 (*)    All other components within normal limits  CHLAMYDIA/NGC RT PCR ([facility] ONLY)  POC URINE PREG, ED  POCT PREGNANCY, URINE    PROCEDURES  Procedure(s) performed (including Critical Care):  Procedures   ____________________________________________   INITIAL IMPRESSION / ASSESSMENT AND PLAN / ED COURSE  As part of my medical decision making, I reviewed the following data within the electronic MEDICAL RECORD NUMBER Notes from prior ED visits and [place] Controlled Substance Database  24 year old female presents to the ED with complaint of dysuria.  Patient was recently treated for chlamydia with a Zithromax and did not have any symptoms until recently with dysuria and pressure sensation.  Urinalysis is consistent with a UTI.  Wet prep was unremarkable as was her pelvic exam.  A culture for both gonorrhea and chlamydia was sent to the lab for reassurance that patient was treated appropriately 2 weeks ago.  Patient encouraged to increase fluids.  She also is to follow-up with her PCP or health department if any continued problems.  Also patient is able to swallow pills and needs a tablet in order to crush it up.   ____________________________________________   FINAL CLINICAL IMPRESSION(S) / ED DIAGNOSES  Final diagnoses:  Acute cystitis with hematuria     ED Discharge Orders         Ordered    sulfamethoxazole-trimethoprim (BACTRIM DS) 800-160 MG tablet  2 times daily     12/02/19 1427           Note:  This document was prepared using Dragon voice recognition software and may include unintentional dictation errors.      Johnn Hai, PA-C 12/02/19 1558    Carrie Mew, MD 12/03/19 2210

## 2019-12-02 NOTE — ED Triage Notes (Signed)
Pt arrived via POV with c/o pressure and burning with urination.  Pt states she has been having protected intercourse more frequently as well when sxs started.  Pt states she has burning at times when she isn't using the bathroom, but c/o pressure.

## 2019-12-11 ENCOUNTER — Encounter: Payer: Self-pay | Admitting: Emergency Medicine

## 2019-12-11 DIAGNOSIS — F17290 Nicotine dependence, other tobacco product, uncomplicated: Secondary | ICD-10-CM | POA: Insufficient documentation

## 2019-12-11 DIAGNOSIS — M545 Low back pain: Secondary | ICD-10-CM | POA: Insufficient documentation

## 2019-12-11 DIAGNOSIS — N390 Urinary tract infection, site not specified: Secondary | ICD-10-CM | POA: Insufficient documentation

## 2019-12-11 LAB — CBC
HCT: 38.6 % (ref 36.0–46.0)
Hemoglobin: 12.8 g/dL (ref 12.0–15.0)
MCH: 29.4 pg (ref 26.0–34.0)
MCHC: 33.2 g/dL (ref 30.0–36.0)
MCV: 88.7 fL (ref 80.0–100.0)
Platelets: 195 10*3/uL (ref 150–400)
RBC: 4.35 MIL/uL (ref 3.87–5.11)
RDW: 12.1 % (ref 11.5–15.5)
WBC: 6.5 10*3/uL (ref 4.0–10.5)
nRBC: 0 % (ref 0.0–0.2)

## 2019-12-11 LAB — POCT PREGNANCY, URINE: Preg Test, Ur: NEGATIVE

## 2019-12-11 NOTE — ED Triage Notes (Signed)
Pt c/o lower back pain, dysuria since 5/22. Pt was seen and diagnosed with UTI on 5/22, prescribed with bactrim. Pt reports she is back to ED due to increased pain and unresolved symptoms.

## 2019-12-12 ENCOUNTER — Emergency Department
Admission: EM | Admit: 2019-12-12 | Discharge: 2019-12-12 | Disposition: A | Discharge status: Home / Self Care | Payer: Self-pay | Attending: Emergency Medicine | Admitting: Emergency Medicine

## 2019-12-12 DIAGNOSIS — N390 Urinary tract infection, site not specified: Secondary | ICD-10-CM

## 2019-12-12 DIAGNOSIS — M545 Low back pain, unspecified: Secondary | ICD-10-CM

## 2019-12-12 LAB — COMPREHENSIVE METABOLIC PANEL
ALT: 15 U/L (ref 0–44)
AST: 21 U/L (ref 15–41)
Albumin: 4.2 g/dL (ref 3.5–5.0)
Alkaline Phosphatase: 52 U/L (ref 38–126)
Anion gap: 6 (ref 5–15)
BUN: 12 mg/dL (ref 6–20)
CO2: 24 mmol/L (ref 22–32)
Calcium: 8.9 mg/dL (ref 8.9–10.3)
Chloride: 107 mmol/L (ref 98–111)
Creatinine, Ser: 0.7 mg/dL (ref 0.44–1.00)
GFR calc Af Amer: >60 mL/min (ref >60)
GFR calc non Af Amer: >60 mL/min (ref >60)
Glucose, Bld: 100 mg/dL — ABNORMAL HIGH (ref 70–99)
Potassium: 4 mmol/L (ref 3.5–5.1)
Sodium: 137 mmol/L (ref 135–145)
Total Bilirubin: 0.5 mg/dL (ref 0.3–1.2)
Total Protein: 7.1 g/dL (ref 6.5–8.1)

## 2019-12-12 LAB — URINALYSIS, COMPLETE (UACMP) WITH MICROSCOPIC
Bacteria, UA: NONE SEEN
Bilirubin Urine: NEGATIVE
Glucose, UA: NEGATIVE mg/dL
Hgb urine dipstick: NEGATIVE
Ketones, ur: NEGATIVE mg/dL
Leukocytes,Ua: NEGATIVE
Nitrite: NEGATIVE
Protein, ur: NEGATIVE mg/dL
Specific Gravity, Urine: 1.021 (ref 1.005–1.030)
pH: 6 (ref 5.0–8.0)

## 2019-12-12 LAB — LIPASE, BLOOD: Lipase: 57 U/L — ABNORMAL HIGH (ref 11–51)

## 2019-12-12 MED ORDER — SULFAMETHOXAZOLE-TRIMETHOPRIM 200-40 MG/5ML PO SUSP: 20.0000 mL | Freq: Two times a day (BID) | ORAL | 0 refills | Status: AC

## 2019-12-12 MED ORDER — PHENAZOPYRIDINE HCL 95 MG PO TABS: 190.0000 mg | ORAL_TABLET | Freq: Three times a day (TID) | ORAL | 0 refills | Status: DC | PRN

## 2019-12-12 NOTE — ED Notes (Signed)
Pt states she has lower back pain which began yesterday morning around 10 am.  Rates pain currently 2/10.  Pt states this pain is new and it is worse with movement, bending.  Pt reports some minor burning after urination but it is improved since taking Bactrim for her UTI.

## 2019-12-12 NOTE — Discharge Instructions (Signed)
As we discussed, your work-up was very reassuring tonight.  Since you took your antibiotics only intermittently and not for the full course of treatment, I am providing an additional 3-day course of the same medication but as a liquid that you can take twice a day.  This should complete an adequate antibiotic treatment course.  I also prescribed a medicine called phenazopyridine (Pyridium) which can help with the burning and bladder spasms that you may be experiencing after you urinate.  Remember that it will likely turn your urine unusual orange color.  Follow-up with the number provided to establish a primary care doctor.  You may also call the [facility] and set up an appointment; they help people that do not have insurance and who may have limited financial resources.    Return to the emergency department if you develop new or worsening symptoms that concern you.

## 2019-12-12 NOTE — ED Provider Notes (Signed)
[facility] Emergency Department Provider Note  ____________________________________________   First MD Initiated Contact with Patient 12/12/19 0239     (approximate)  I have reviewed the triage vital signs and the nursing notes.   HISTORY  Chief Complaint Back Pain    HPI Jacqueline Villarreal is a 24 y.o. female who was seen about 10 or 11 days ago and diagnosed with urinary tract infection the presents tonight for unresolved symptoms.  She reports that she was prescribed Bactrim but she did not take it consistently, only once a day or occasionally because she has a very hard time swallowing pills.  She said that her symptoms seem to be improving but tonight she had some sharp stabbing pain in her back and she was afraid that something might be getting worse.  She currently has no pain.  She reports that she still has burning pain after she urinates but sees no blood in her urine.  She denies fever/chills, sore throat, chest pain, shortness of breath, nausea, vomiting, and abdominal pain.  Nothing in particular makes her symptoms better or worse although she did improve initially at least in terms of the dysuria while taking the Bactrim even though she was inconsistent with it.         Past Medical History:  Diagnosis Date  . Ovarian cyst     There are no problems to display for this patient.   Past Surgical History:  Procedure Laterality Date  . MOUTH SURGERY      Prior to Admission medications   Medication Sig Start Date End Date Taking? Authorizing Provider  phenazopyridine (PYRIDIUM) 95 MG tablet Take 2 tablets (190 mg total) by mouth 3 (three) times daily with meals as needed for pain (bladder pain/spasms). 12/12/19   Loleta Rose, MD  sulfamethoxazole-trimethoprim (BACTRIM) 200-40 MG/5ML suspension Take 20 mLs by mouth 2 (two) times daily for 3 days. 12/12/19 12/15/19  Loleta Rose, MD    Allergies Patient has no known allergies.  Family History    Problem Relation Age of Onset  . Cancer Paternal Grandmother   . Irritable bowel syndrome Paternal Grandmother     Social History Social History   Tobacco Use  . Smoking status: Current Every Day Smoker    Types: E-cigarettes  . Smokeless tobacco: Never Used  . Tobacco comment: "I vape every day all day."   Substance Use Topics  . Alcohol use: Yes    Comment: rarely  . Drug use: No    Review of Systems Constitutional: No fever/chills Eyes: No visual changes. ENT: No sore throat. Cardiovascular: Denies chest pain. Respiratory: Denies shortness of breath. Gastrointestinal: No abdominal pain.  No nausea, no vomiting.  No diarrhea.  No constipation. Genitourinary: +dysuria. Musculoskeletal: Acute onset back pain, now resolved.   Integumentary: Negative for rash. Neurological: Negative for headaches, focal weakness or numbness.   ____________________________________________   PHYSICAL EXAM:  VITAL SIGNS: ED Triage Vitals  Enc Vitals Group     BP 12/11/19 2324 119/76     Pulse Rate 12/11/19 2324 87     Resp 12/11/19 2324 17     Temp 12/11/19 2324 98.8 F (37.1 C)     Temp Source 12/11/19 2324 Oral     SpO2 12/11/19 2324 100 %     Weight --      Height --      Head Circumference --      Peak Flow --      Pain Score 12/12/19  0309 2     Pain Loc --      Pain Edu? --      Excl. in GC? --     Constitutional: Alert and oriented.  Well-appearing and in no distress. Eyes: Conjunctivae are normal.  Head: Atraumatic. Nose: No congestion/rhinnorhea. Mouth/Throat: Patient is wearing a mask. Neck: No stridor.  No meningeal signs.   Cardiovascular: Normal rate, regular rhythm. Good peripheral circulation. Grossly normal heart sounds. Respiratory: Normal respiratory effort.  No retractions. Gastrointestinal: Soft and nontender. No distention.  No epigastric pain or tenderness. Musculoskeletal: No CVA tenderness to percussion.  No lower extremity tenderness nor edema. No  gross deformities of extremities. Neurologic:  Normal speech and language. No gross focal neurologic deficits are appreciated.  Skin:  Skin is warm, dry and intact. Psychiatric: Mood and affect are normal. Speech and behavior are normal.  ____________________________________________   LABS (all labs ordered are listed, but only abnormal results are displayed)  Labs Reviewed  LIPASE, BLOOD - Abnormal; Notable for the following components:      Result Value   Lipase 57 (*)    All other components within normal limits  COMPREHENSIVE METABOLIC PANEL - Abnormal; Notable for the following components:   Glucose, Bld 100 (*)    All other components within normal limits  URINALYSIS, COMPLETE (UACMP) WITH MICROSCOPIC - Abnormal; Notable for the following components:   Color, Urine YELLOW (*)    APPearance HAZY (*)    All other components within normal limits  URINE CULTURE  CBC  POC URINE PREG, ED  POCT PREGNANCY, URINE   ____________________________________________  EKG  No indication for emergent EKG ____________________________________________  RADIOLOGY I, Loleta Rose, personally viewed and evaluated these images (plain radiographs) as part of my medical decision making, as well as reviewing the written report by the radiologist.  ED MD interpretation: No indication for emergent imaging  Official radiology report(s): No results found.  ____________________________________________   PROCEDURES   Procedure(s) performed (including Critical Care):  Procedures   ____________________________________________   INITIAL IMPRESSION / MDM / ASSESSMENT AND PLAN / ED COURSE  As part of my medical decision making, I reviewed the following data within the electronic MEDICAL RECORD NUMBER Nursing notes reviewed and incorporated, Labs reviewed , Old chart reviewed and Notes from prior ED visits   Differential diagnosis includes, but is not limited to, persistent urinary tract  infection, obstructive uropathy such as a kidney or ureteral stone, musculoskeletal strain.  The patient's vital signs are stable and she is afebrile.  I compared lab work-up on her last visit versus tonight and she had reassuring lab results each time.  However tonight her labs are notable for a nonspecific mild lipase elevation of 57 which does not correlate clinically for any acute or emergent abnormality such as pancreatitis given the lack of correlating symptoms, as well as a much improved urinalysis with no evidence of blood, ketones, etc.  Her urine pregnancy test is negative.  She had a pelvic exam with wet prep and GC/chlamydia tests the last time, all of which were negative.  She has no tenderness to palpation of the abdomen including both the lower and upper abdomen.  Given her inconsistent and incomplete antibiotic treatment and persistent dysuria as well as an episode of low back pain tonight (which may be unrelated), I will treat her with a 3-day course of Septra suspension twice daily which will be equivalent to 160/800 mg dosing of Bactrim DS.  I am also giving her  a prescription for Pyridium and a GoodRx card.  We discussed getting a CT scan, but I explained I did not think it was medically necessary and that it would be a lot of extra radiation and an expensive test when it is unlikely to reveal any acute or emergent abnormality, and she agreed and does not want to proceed with the test.  I gave her follow-up information for how to establish a primary care provider and I gave my usual and customary return precautions.           ____________________________________________  FINAL CLINICAL IMPRESSION(S) / ED DIAGNOSES  Final diagnoses:  Acute bilateral low back pain without sciatica  Urinary tract infection without hematuria, site unspecified     MEDICATIONS GIVEN DURING THIS VISIT:  Medications - No data to display   ED Discharge Orders         Ordered     sulfamethoxazole-trimethoprim (BACTRIM) 200-40 MG/5ML suspension  2 times daily     12/12/19 0311    phenazopyridine (PYRIDIUM) 95 MG tablet  3 times daily with meals PRN     12/12/19 6384          *Please note:  JESELLE HISER was evaluated in Emergency Department on 12/12/2019 for the symptoms described in the history of present illness. She was evaluated in the context of the global COVID-19 pandemic, which necessitated consideration that the patient might be at risk for infection with the SARS-CoV-2 virus that causes COVID-19. Institutional protocols and algorithms that pertain to the evaluation of patients at risk for COVID-19 are in a state of rapid change based on information released by regulatory bodies including the CDC and federal and state organizations. These policies and algorithms were followed during the patient's care in the ED.  Some ED evaluations and interventions may be delayed as a result of limited staffing during the pandemic.*  Note:  This document was prepared using Dragon voice recognition software and may include unintentional dictation errors.   Hinda Kehr, MD 12/12/19 [phone]

## 2019-12-13 LAB — URINE CULTURE: Culture: <10000 — AB

## 2020-01-18 ENCOUNTER — Telehealth: Payer: Self-pay | Admitting: General Practice

## 2020-01-18 NOTE — Telephone Encounter (Signed)
Individual has been contacted 3+ times regarding ED referral and the call could not be completed each time. No further attempts to contact the individual will be made.

## 2020-12-26 ENCOUNTER — Telehealth: Payer: Self-pay | Admitting: Family Medicine

## 2020-12-26 NOTE — Telephone Encounter (Signed)
Pt wants to speak with a nurse about an incident that happened to her today in reference to STDs.

## 2020-12-26 NOTE — Telephone Encounter (Signed)
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  "Should I be concerned?"  Pt is unaware of the individual having any STD's.  Water did not enter mucous membranes or eyes.  Informed her that there is no need to be tested or be concerned. Berdie Ogren, RN

## 2021-10-24 ENCOUNTER — Encounter: Payer: Self-pay | Admitting: Family Medicine

## 2021-10-24 ENCOUNTER — Ambulatory Visit (LOCAL_COMMUNITY_HEALTH_CENTER): Payer: Self-pay | Admitting: Family Medicine

## 2021-10-24 VITALS — BP 109/71 | Ht 64.0 in | Wt 114.2 lb

## 2021-10-24 DIAGNOSIS — F418 Other specified anxiety disorders: Secondary | ICD-10-CM | POA: Insufficient documentation

## 2021-10-24 DIAGNOSIS — R4589 Other symptoms and signs involving emotional state: Secondary | ICD-10-CM | POA: Insufficient documentation

## 2021-10-24 DIAGNOSIS — Z0000 Encounter for general adult medical examination without abnormal findings: Secondary | ICD-10-CM

## 2021-10-24 DIAGNOSIS — Z309 Encounter for contraceptive management, unspecified: Secondary | ICD-10-CM

## 2021-10-24 NOTE — Patient Instructions (Signed)
Please reach out to the crisis control number that was given.   ? ?NO GOOGLING health conditions  ? ?Discuss with partner about getting screened for STI  ? ?Return to clinic when you are ready to be screened.   ? ? ?You got this  ? ?Wendi Snipes, FNP ? ?

## 2021-10-24 NOTE — Progress Notes (Signed)
[facility] ?[place] ?Main Number: [phone] ? ? ? ?Family Planning Visit- Initial Visit ? ?Subjective:  ?Jacqueline Villarreal is a 26 y.o.  [id number]   being seen today for an initial annual visit and to discuss reproductive life planning.  The patient is currently using No Method - No Contraceptive Precautions for pregnancy prevention. Patient reports   does not want a pregnancy in the next year.   ? ? report they are not looking for a method.  ? ?Patient has the following medical conditions does not have a problem list on file. ? ?Chief Complaint  ?Patient presents with  ? Annual Exam  ? ? ?Patient reports here for Physical exam  ? ?Patient denies any other concerns than on intake form, see intake form.    ? ?Body mass index is 19.6 kg/m?. - Patient is eligible for diabetes screening based on BMI and age [id number]?  not applicable ?HA1C ordered? not applicable ? ?Patient reports 1  partner/s in last year. Desires STI screening?  No - declined  ? ?Has patient been screened once for HCV in the past?  No ? No results found for: HCVAB ? ?Does the patient have current drug use (including MJ), have a partner with drug use, and/or has been incarcerated since last result? No  ?If yes-- Screen for HCV through [facility] ?  ?Does the patient meet criteria for HBV testing? No ? ?Criteria:  ?-Household, sexual or needle sharing contact with HBV ?-History of drug use ?-HIV positive ?-Those with known Hep C ? ? ?Health Maintenance Due  ?Topic Date Due  ? COVID-19 Vaccine (1) Never done  ? HPV VACCINES (1 - 2-dose series) Never done  ? HIV Screening  Never done  ? Hepatitis C Screening  Never done  ? PAP-Cervical Cytology Screening  Never done  ? PAP SMEAR-Modifier  Never done  ? TETANUS/TDAP  03/04/2020  ? ? ?Review of Systems  ?Constitutional:  Negative for chills, fever, malaise/fatigue and weight loss.  ?HENT:  Negative for congestion, hearing loss and sore throat.    ?Eyes:  Negative for blurred vision, double vision and photophobia.  ?Respiratory:  Positive for shortness of breath.   ?Cardiovascular:  Negative for chest pain.  ?Gastrointestinal:  Negative for abdominal pain, blood in stool, constipation, diarrhea, heartburn, nausea and vomiting.  ?Genitourinary:  Negative for dysuria and frequency.  ?Musculoskeletal:  Negative for back pain, joint pain and neck pain.  ?Skin:  Negative for itching and rash.  ?Neurological:  Positive for headaches. Negative for dizziness and weakness.  ?Endo/Heme/Allergies:  Does not bruise/bleed easily.  ?Psychiatric/Behavioral:  Negative for depression, substance abuse and suicidal ideas. The patient is nervous/anxious.   ? ?The following portions of the patient's history were reviewed and updated as appropriate: allergies, current medications, past family history, past medical history, past social history, past surgical history and problem list. Problem list updated. ? ? ?See flowsheet for other program required questions. ? ?Objective:  ? ?Vitals:  ? 10/24/21 0958  ?BP: 109/71  ?Weight: 114 lb 3.2 oz (51.8 kg)  ?Height: 5\' 4"  (1.626 m)  ? ? ?Physical Exam ?Vitals and nursing note reviewed.  ?Constitutional:   ?   Appearance: Normal appearance.  ?HENT:  ?   Head: Normocephalic and atraumatic.  ?   Mouth/Throat:  ?   Mouth: Mucous membranes are moist.  ?   Pharynx: No oropharyngeal exudate or posterior oropharyngeal erythema.  ?Eyes:  ?  General: No scleral icterus. ?Cardiovascular:  ?   Rate and Rhythm: Regular rhythm. Tachycardia present.  ?   Pulses: Normal pulses.  ?   Heart sounds: Normal heart sounds.  ?Pulmonary:  ?   Effort: Pulmonary effort is normal.  ?   Breath sounds: Normal breath sounds.  ?Abdominal:  ?   General: Abdomen is flat. Bowel sounds are normal.  ?   Palpations: Abdomen is soft.  ?Genitourinary: ?   Comments: Deferred- pt declined  ?Musculoskeletal:     ?   General: Normal range of motion.  ?   Cervical back: Normal  range of motion and neck supple.  ?Skin: ?   General: Skin is warm and dry.  ?Neurological:  ?   General: No focal deficit present.  ?   Mental Status: She is alert and oriented to person, place, and time.  ?Psychiatric:     ?   Mood and Affect: Affect is tearful.     ?   Behavior: Behavior is agitated. Behavior is cooperative.     ?   Thought Content: Thought content is paranoid and delusional. Thought content does not include homicidal or suicidal plan.     ?   Cognition and Memory: Cognition is impaired.     ?   Judgment: Judgment is inappropriate.  ? ? ? ? ?Assessment and Plan:  ?Jacqueline Villarreal is a 26 y.o. female presenting to the [facility] Department for an initial annual wellness/contraceptive visit ? ?Contraception counseling: Reviewed options based on patient desire and reproductive life plan. Patient is interested in No Method - No Contraceptive Precautions. This was provided to the patient today.  Pt declined BCM ? ?Risks, benefits, and typical effectiveness rates were reviewed.  Questions were answered.  Written information was also given to the patient to review.   ? ?The patient will follow up in  as needed  for surveillance.  The patient was told to call with any further questions, or with any concerns about this method of contraception.  Emphasized use of condoms 100% of the time for STI prevention. ? ?Need for ECP was assessed. Patient reported > 120 hours .   ?Last sex was 1.5 months ago.  ? ?1. Routine general medical examination at a health care facility ?Well woman exam  ?Declined PAP today d/t menses  ?Declined STI  screening test.   ?CBE today  ? ?2. Anxiety about health ?Pt has high anxiety, and delusional thoughts.She is convinced that she is HIV positive.  Pt has never been tested or been told by previous partners of HIV contact.  ?  Pt reports that she has felt this way since she tested positive for chlamydia in 2021.  ?Pt reports that she does not want to know because she  could not handle if she was positive.   ? ?Pt googled "reason for fever and chills" and result said HIV.  Pt reports having fever and chills for 1 day and then she was back to normal. But she thinks that she has this fever d/t having HIV ? ?Attempted to discuss normal body reactions to any type of infection.   ?Discussed with patient about therapy to help cope with anxiety and then working or normal preventive screenings .   ? ?Pt PHQ-9 result was 6.  Pt denies self harm or harm to others.   ? ?Pt accepts referral to [facility]. While stating " I do not want to be on benzos or the psych ward".  ? ?-  Ambulatory referral to [place] ? ? ? ? ?Return in about 2 weeks (around 11/07/2021) for screening . ? ?No future appointments. ? ?Junious Dresser, FNP ?

## 2021-10-24 NOTE — Progress Notes (Signed)
Patient here for Annual exam. Declines pap smear due to her menstrual cycle.  ?Condoms declined. PHQ-9 form completed.  ?

## 2021-11-19 ENCOUNTER — Ambulatory Visit: Payer: Self-pay | Admitting: Licensed Clinical Social Worker

## 2021-12-03 ENCOUNTER — Telehealth: Payer: Self-pay | Admitting: Family Medicine

## 2021-12-03 NOTE — Telephone Encounter (Signed)
Pt is on her period, but it has not been as heavy.  She pulled out her tampon and it was "falling apart."  She is concerned that she may have pieces of her tampon stuck in her vagina.  Pt informed that she will need to be examined by a Provider to determine if she has any foreign material in her vagina.  Pt verbalizes understanding.

## 2022-01-19 ENCOUNTER — Ambulatory Visit: Payer: Self-pay

## 2022-01-28 ENCOUNTER — Ambulatory Visit: Payer: Self-pay

## 2022-03-24 ENCOUNTER — Encounter: Payer: Self-pay | Admitting: Nurse Practitioner

## 2022-03-24 ENCOUNTER — Ambulatory Visit: Payer: Self-pay | Admitting: Nurse Practitioner

## 2022-03-24 DIAGNOSIS — Z113 Encounter for screening for infections with a predominantly sexual mode of transmission: Secondary | ICD-10-CM

## 2022-03-24 DIAGNOSIS — N83209 Unspecified ovarian cyst, unspecified side: Secondary | ICD-10-CM | POA: Insufficient documentation

## 2022-03-24 LAB — WET PREP FOR TRICH, YEAST, CLUE
Trichomonas Exam: NEGATIVE
Yeast Exam: NEGATIVE

## 2022-03-24 LAB — HM HIV SCREENING LAB: HM HIV Screening: NEGATIVE

## 2022-03-24 NOTE — Progress Notes (Signed)
[facility] Department  STI clinic/screening visit [place] [address] [phone]  Subjective:  Jacqueline Villarreal is a 26 y.o. female being seen today for an STI screening visit. The patient reports they {Actions; do/do not:19616} have symptoms.  Patient reports that they {Actions; do/do not:19616} desire a pregnancy in the next year.   They reported they {Actions; are/are not:16769} interested in discussing contraception today.    Patient's last menstrual period was 03/24/2022.   Patient has the following medical conditions:   Patient Active Problem List   Diagnosis Date Noted  . Ovarian cyst 03/24/2022  . Anxiety about health 10/24/2021    Chief Complaint  Patient presents with  . SEXUALLY TRANSMITTED DISEASE    Intermittent irritation at the urethra.     HPI  Patient reports ***  Last HIV test per patient/review of record was *** Patient reports last pap was ***.   Screening for MPX risk: Does the patient have an unexplained rash? {yes/no:20286} Is the patient MSM? {yes/no:20286} Does the patient endorse multiple sex partners or anonymous sex partners? {yes/no:20286} Did the patient have close or sexual contact with a person diagnosed with MPX? {yes/no:20286} Has the patient traveled outside the [place] where MPX is endemic? {yes/no:20286} Is there a high clinical suspicion for MPX-- evidenced by one of the following {yes/no:20286}  -Unlikely to be chickenpox  -Lymphadenopathy  -Rash that present in same phase of evolution on any given body part See flowsheet for further details and programmatic requirements.   Immunization history:  Immunization History  Administered Date(s) Administered  . Hepatitis B 08/12/1998, 11/05/1998, 03/13/1999  . MMR 07/23/1999, 11/22/2003  . Tdap 03/04/2010  . Varicella 11/22/2003     The following portions of the patient's history were reviewed and updated as appropriate: allergies, current  medications, past medical history, past social history, past surgical history and problem list.  Objective:  There were no vitals filed for this visit.  Physical Exam   Assessment and Plan:  Jacqueline Villarreal is a 26 y.o. female presenting to the [facility] Department for STI screening  1. Screening examination for venereal disease *** - [facility] - Syphilis Serology,  Lab - [place]     Return if symptoms worsen or fail to improve.  No future appointments.  Gregary Cromer, FNP

## 2022-04-02 ENCOUNTER — Telehealth: Payer: Self-pay | Admitting: Family Medicine

## 2022-04-03 NOTE — Telephone Encounter (Signed)
Pt wanted to know about her STD results.  Pt notified that her HIV results were negative and that we are still waiting on her Chlamydia, Gonorrhea and Syphilis results.

## 2022-06-18 ENCOUNTER — Emergency Department
Admission: EM | Admit: 2022-06-18 | Discharge: 2022-06-18 | Disposition: A | Discharge status: Home / Self Care | Payer: Self-pay | Attending: Emergency Medicine | Admitting: Emergency Medicine

## 2022-06-18 ENCOUNTER — Other Ambulatory Visit: Payer: Self-pay

## 2022-06-18 ENCOUNTER — Encounter: Payer: Self-pay | Admitting: *Deleted

## 2022-06-18 DIAGNOSIS — W272XXA Contact with scissors, initial encounter: Secondary | ICD-10-CM | POA: Insufficient documentation

## 2022-06-18 DIAGNOSIS — Z23 Encounter for immunization: Secondary | ICD-10-CM | POA: Insufficient documentation

## 2022-06-18 DIAGNOSIS — S61032A Puncture wound without foreign body of left thumb without damage to nail, initial encounter: Secondary | ICD-10-CM | POA: Insufficient documentation

## 2022-06-18 DIAGNOSIS — S61012A Laceration without foreign body of left thumb without damage to nail, initial encounter: Secondary | ICD-10-CM | POA: Insufficient documentation

## 2022-06-18 DIAGNOSIS — T148XXA Other injury of unspecified body region, initial encounter: Secondary | ICD-10-CM

## 2022-06-18 MED ORDER — TETANUS-DIPHTH-ACELL PERTUSSIS 5-2.5-18.5 LF-MCG/0.5 IM SUSY
0.5000 mL | PREFILLED_SYRINGE | Freq: Once | INTRAMUSCULAR | Status: AC
  Administered 2022-06-18: 0.5 mL via INTRAMUSCULAR
  Filled 2022-06-18: qty 0.5

## 2022-06-18 NOTE — ED Notes (Signed)
Pt in with small puncture wound to L thumb. Site slightly swollen with tiny red wound at center; no bleeding currently noted; pt can move thumb appropriately; pt's father had her soak it in a hydrogen peroxide, water and iodine mix at home before coming in to ER; band aide reapplied after this RN assessed thumb; pt educated about assessing for signs of infection daily, and to use only simple soap and water to clean and neosporin with Band-Aid to protect site.

## 2022-06-18 NOTE — ED Provider Notes (Signed)
[facility] Provider Note  Patient Contact: 10:27 PM (approximate)   History   Puncture Wound   HPI  Jacqueline Villarreal is a 26 y.o. female presents to the emergency department with a 0.5 cm left thumb laceration sustained accidentally with scissors.  Patient reports that her tetanus status needs updating.  No numbness or tingling in left hand.      Physical Exam   Triage Vital Signs: ED Triage Vitals  Enc Vitals Group     BP 06/18/22 2023 122/83     Pulse Rate 06/18/22 2023 89     Resp 06/18/22 2023 18     Temp 06/18/22 2023 98.3 F (36.8 C)     Temp Source 06/18/22 2023 Oral     SpO2 06/18/22 2023 99 %     Weight 06/18/22 2020 116 lb (52.6 kg)     Height 06/18/22 2020 5\' 4"  (1.626 m)     Head Circumference --      Peak Flow --      Pain Score 06/18/22 2020 10     Pain Loc --      Pain Edu? --      Excl. in GC? --     Most recent vital signs: Vitals:   06/18/22 2023  BP: 122/83  Pulse: 89  Resp: 18  Temp: 98.3 F (36.8 C)  SpO2: 99%     General: Alert and in no acute distress. Eyes:  PERRL. EOMI. Head: No acute traumatic findings ENT:      Nose: No congestion/rhinnorhea.      Mouth/Throat: Mucous membranes are moist. Neck: No stridor. No cervical spine tenderness to palpation. Cardiovascular:  Good peripheral perfusion Respiratory: Normal respiratory effort without tachypnea or retractions. Lungs CTAB. Good air entry to the bases with no decreased or absent breath sounds. Gastrointestinal: Bowel sounds 4 quadrants. Soft and nontender to palpation. No guarding or rigidity. No palpable masses. No distention. No CVA tenderness. Musculoskeletal: Full range of motion to all extremities.  Patient has a superficial laceration of the left thumb.  No flexor or extensor tendon deficits appreciated with testing.  Palpable radial pulses bilaterally and symmetrically. Neurologic:  No gross focal neurologic deficits are appreciated.     ED  Results / Procedures / Treatments   Labs (all labs ordered are listed, but only abnormal results are displayed) Labs Reviewed - No data to display     PROCEDURES:  Critical Care performed: No  Procedures   MEDICATIONS ORDERED IN ED: Medications  Tdap (BOOSTRIX) injection 0.5 mL (has no administration in time range)     IMPRESSION / MDM / ASSESSMENT AND PLAN / ED COURSE  I reviewed the triage vital signs and the nursing notes.                              Assessment and plan Hand pain 26 year old female presents to the emergency department with acute left thumb pain.  Patient has a 0.5 cm superficial appearing laceration that does not need repair.  Patient and copiously irrigated wound prior to being seen in the emergency department.  Tylenol and ibuprofen alternating were recommended for discomfort and wound care was provided in the emergency department.  Tetanus status was updated prior to discharge.      FINAL CLINICAL IMPRESSION(S) / ED DIAGNOSES   Final diagnoses:  Puncture wound     Rx / DC Orders   ED Discharge  Orders     None        Note:  This document was prepared using Dragon voice recognition software and may include unintentional dictation errors.   Pia Mau [place], PA-C 06/18/22 2232    Arnaldo Natal, MD 06/18/22 [phone]

## 2022-06-18 NOTE — ED Triage Notes (Signed)
Pt has a puncture wound to left thumb.  Pt cut self with scissors.  Bleeding controlled.  Pt alert  speech clear.

## 2022-09-17 ENCOUNTER — Ambulatory Visit: Payer: Self-pay | Admitting: Family Medicine

## 2022-10-08 ENCOUNTER — Ambulatory Visit: Payer: Self-pay | Admitting: Family

## 2022-10-08 ENCOUNTER — Encounter: Payer: Self-pay | Admitting: Family

## 2022-10-08 DIAGNOSIS — Z113 Encounter for screening for infections with a predominantly sexual mode of transmission: Secondary | ICD-10-CM

## 2022-10-08 LAB — WET PREP FOR TRICH, YEAST, CLUE
Trichomonas Exam: NEGATIVE
Yeast Exam: NEGATIVE

## 2022-10-08 NOTE — Progress Notes (Signed)
Pt is here for STD screening.  Wet mount results reviewed, no treatment required.  Condoms declined.  Zuzu Befort M Natnael Biederman, RN  

## 2022-10-08 NOTE — Progress Notes (Signed)
[facility] Department  STI clinic/screening visit [place] [address] [phone]  Subjective:  Jacqueline Villarreal is a 27 y.o. female being seen today for an STI screening visit. The patient reports they do not have symptoms.  Patient reports that they do not desire a pregnancy in the next year.   They reported they are not interested in discussing contraception today.    Patient's last menstrual period was 09/01/2022 (approximate).  Patient has the following medical conditions:   Patient Active Problem List   Diagnosis Date Noted   Ovarian cyst 03/24/2022   Anxiety about health 10/24/2021    Chief Complaint  Patient presents with   SEXUALLY TRANSMITTED DISEASE    Screening    HPI  Patient reports wants STI testing, vaginal and oral tests only, just had bloodwork testing within the last month.  Denies STI symptoms but has a new partner since end of February 2024.  Does the patient using douching products? No  Last HIV test per patient/review of record was  Lab Results  Component Value Date   HMHIVSCREEN Negative - Validated 03/24/2022   No results found for: "HIV" Patient reports last pap was No results found for: "DIAGPAP" No results found for: "SPECADGYN"  Screening for MPX risk: Does the patient have an unexplained rash? No Is the patient MSM? No Does the patient endorse multiple sex partners or anonymous sex partners? No Did the patient have close or sexual contact with a person diagnosed with MPX? No Has the patient traveled outside the [place] where MPX is endemic? No Is there a high clinical suspicion for MPX-- evidenced by one of the following No  -Unlikely to be chickenpox  -Lymphadenopathy  -Rash that present in same phase of evolution on any given body part See flowsheet for further details and programmatic requirements.   Immunization history:  Immunization History  Administered Date(s) Administered   Hepatitis B  08/12/1998, 11/05/1998, 03/13/1999   MMR 07/23/1999, 11/22/2003   Tdap 03/04/2010, 06/18/2022   Varicella 11/22/2003     The following portions of the patient's history were reviewed and updated as appropriate: allergies, current medications, past medical history, past social history, past surgical history and problem list.  Objective:  There were no vitals filed for this visit.  Physical Exam Patient declines physical examination and chooses to self-swab for GC/CT and wet prep.  Assessment and Plan:  Jacqueline Villarreal is a 27 y.o. female presenting to the [facility] Department for STI screening  1. Screening for venereal disease Will contact with positive results Use condoms for all sex  - [place], [place], CLUE   Patient accepted all screenings including oral, vaginal CT/GC and bloodwork for HIV/RPR, and wet prep. Patient meets criteria for HepB screening? No. Ordered? no Patient meets criteria for HepC screening? No. Ordered? no  Treat wet prep per standing order Discussed time line for [facility] results and that patient will be called with positive results and encouraged patient to call if she had not heard in 2 weeks.  Counseled to return or seek care for continued or worsening symptoms Recommended repeat testing in 3 months with positive results. Recommended condom use with all sex  Patient is currently using condoms to prevent pregnancy.    Return if symptoms worsen or fail to improve.  No future appointments.  Marline Backbone, FNP

## 2022-10-13 LAB — GONOCOCCUS CULTURE

## 2023-05-02 ENCOUNTER — Emergency Department
Admission: EM | Admit: 2023-05-02 | Discharge: 2023-05-03 | Disposition: A | Discharge status: Home / Self Care | Payer: Self-pay | Attending: Emergency Medicine | Admitting: Emergency Medicine

## 2023-05-02 DIAGNOSIS — R1010 Upper abdominal pain, unspecified: Secondary | ICD-10-CM | POA: Insufficient documentation

## 2023-05-02 LAB — COMPREHENSIVE METABOLIC PANEL
ALT: 22 U/L (ref 0–44)
AST: 29 U/L (ref 15–41)
Albumin: 4.7 g/dL (ref 3.5–5.0)
Alkaline Phosphatase: 48 U/L (ref 38–126)
Anion gap: 10 (ref 5–15)
BUN: 10 mg/dL (ref 6–20)
CO2: 23 mmol/L (ref 22–32)
Calcium: 9.5 mg/dL (ref 8.9–10.3)
Chloride: 104 mmol/L (ref 98–111)
Creatinine, Ser: 0.59 mg/dL (ref 0.44–1.00)
GFR, Estimated: >60 mL/min (ref >60)
Glucose, Bld: 103 mg/dL — ABNORMAL HIGH (ref 70–99)
Potassium: 5.1 mmol/L (ref 3.5–5.1)
Sodium: 137 mmol/L (ref 135–145)
Total Bilirubin: 0.9 mg/dL (ref 0.3–1.2)
Total Protein: 8.1 g/dL (ref 6.5–8.1)

## 2023-05-02 LAB — URINALYSIS, ROUTINE W REFLEX MICROSCOPIC
Bilirubin Urine: NEGATIVE
Glucose, UA: NEGATIVE mg/dL
Hgb urine dipstick: NEGATIVE
Ketones, ur: 5 mg/dL — AB
Leukocytes,Ua: NEGATIVE
Nitrite: NEGATIVE
Protein, ur: NEGATIVE mg/dL
Specific Gravity, Urine: 1.017 (ref 1.005–1.030)
pH: 7 (ref 5.0–8.0)

## 2023-05-02 LAB — LIPASE, BLOOD: Lipase: 46 U/L (ref 11–51)

## 2023-05-02 LAB — PREGNANCY, URINE: Preg Test, Ur: NEGATIVE

## 2023-05-02 NOTE — ED Triage Notes (Signed)
Intermittent sharp shooting upper abdominal pain that began on Friday; Patient states that the pain is debilitating but will go away after a few minutes; She's noticed that the pain begins after she eats or drinks; She also has the feeling that she cannot have a full bowel movement; She says she has "stomach issues", no h/x of colitis that she knows of

## 2023-05-03 ENCOUNTER — Emergency Department: Payer: Self-pay

## 2023-05-03 LAB — CBC WITH DIFFERENTIAL/PLATELET
Abs Immature Granulocytes: 0.01 10*3/uL (ref 0.00–0.07)
Basophils Absolute: 0 10*3/uL (ref 0.0–0.1)
Basophils Relative: 0 %
Eosinophils Absolute: 0.2 10*3/uL (ref 0.0–0.5)
Eosinophils Relative: 3 %
HCT: 36.6 % (ref 36.0–46.0)
Hemoglobin: 12.7 g/dL (ref 12.0–15.0)
Immature Granulocytes: 0 %
Lymphocytes Relative: 30 %
Lymphs Abs: 2 10*3/uL (ref 0.7–4.0)
MCH: 29.9 pg (ref 26.0–34.0)
MCHC: 34.7 g/dL (ref 30.0–36.0)
MCV: 86.1 fL (ref 80.0–100.0)
Monocytes Absolute: 0.5 10*3/uL (ref 0.1–1.0)
Monocytes Relative: 7 %
Neutro Abs: 4 10*3/uL (ref 1.7–7.7)
Neutrophils Relative %: 60 %
Platelets: 147 10*3/uL — ABNORMAL LOW (ref 150–400)
RBC: 4.25 MIL/uL (ref 3.87–5.11)
RDW: 11.7 % (ref 11.5–15.5)
WBC: 6.7 10*3/uL (ref 4.0–10.5)
nRBC: 0 % (ref 0.0–0.2)

## 2023-05-03 MED ORDER — SUCRALFATE 1 GM/10ML PO SUSP: 1.0000 g | Freq: Four times a day (QID) | ORAL | 1 refills | Status: DC

## 2023-05-03 MED ORDER — ONDANSETRON 4 MG PO TBDP: 4.0000 mg | ORAL_TABLET | Freq: Four times a day (QID) | ORAL | 0 refills | Status: DC | PRN

## 2023-05-03 MED ORDER — HYDROCODONE-ACETAMINOPHEN 5-325 MG PO TABS: 1.0000 | ORAL_TABLET | Freq: Four times a day (QID) | ORAL | 0 refills | Status: DC | PRN

## 2023-05-03 MED ORDER — ALUM & MAG HYDROXIDE-SIMETH 200-200-20 MG/5ML PO SUSP
30.0000 mL | Freq: Once | ORAL | Status: AC
  Administered 2023-05-03: 30 mL via ORAL
  Filled 2023-05-03: qty 30

## 2023-05-03 MED ORDER — PANTOPRAZOLE SODIUM 40 MG PO TBEC
40.0000 mg | DELAYED_RELEASE_TABLET | Freq: Once | ORAL | Status: DC
  Filled 2023-05-03: qty 1

## 2023-05-03 MED ORDER — PANTOPRAZOLE SODIUM 40 MG PO TBEC: 40.0000 mg | DELAYED_RELEASE_TABLET | Freq: Every day | ORAL | 1 refills | Status: DC

## 2023-05-03 MED ORDER — LIDOCAINE VISCOUS HCL 2 % MT SOLN
15.0000 mL | Freq: Once | OROMUCOSAL | Status: DC
  Filled 2023-05-03: qty 15

## 2023-05-03 NOTE — ED Provider Notes (Signed)
[facility] Provider Note    Event Date/Time   First MD Initiated Contact with Patient 05/03/23 0002     (approximate)   History   Abdominal Pain   HPI  Jacqueline Villarreal is a 27 y.o. female with no significant past medical history who presents to the emergency department with several days of upper abdominal pain that she describes as a burning, vibrating sensation that is worse after eating and drinking.  No fevers, nausea, vomiting, diarrhea, bloody stools or melena.  Has never had similar symptoms.  Symptoms became more frequent today.  No previous abdominal surgery.   History provided by patient, family.    Past Medical History:  Diagnosis Date   Ovarian cyst     Past Surgical History:  Procedure Laterality Date   MOUTH SURGERY      MEDICATIONS:  Prior to Admission medications   Medication Sig Start Date End Date Taking? Authorizing Provider  phenazopyridine (PYRIDIUM) 95 MG tablet Take 2 tablets (190 mg total) by mouth 3 (three) times daily with meals as needed for pain (bladder pain/spasms). Patient not taking: Reported on 10/24/2021 12/12/19   Loleta Rose, MD    Physical Exam   Triage Vital Signs: ED Triage Vitals  Encounter Vitals Group     BP 05/02/23 1950 (!) 129/94     Systolic BP Percentile --      Diastolic BP Percentile --      Pulse Rate 05/02/23 1950 65     Resp 05/02/23 1950 19     Temp 05/02/23 1950 98.3 F (36.8 C)     Temp Source 05/02/23 1950 Oral     SpO2 05/02/23 1950 100 %     Weight 05/02/23 1949 118 lb (53.5 kg)     Height 05/02/23 1949 5\' 4"  (1.626 m)     Head Circumference --      Peak Flow --      Pain Score --      Pain Loc --      Pain Education --      Exclude from Growth Chart --     Most recent vital signs: Vitals:   05/02/23 1950 05/03/23 0047  BP: (!) 129/94 106/62  Pulse: 65 63  Resp: 19 17  Temp: 98.3 F (36.8 C) 98.5 F (36.9 C)  SpO2: 100% 99%    CONSTITUTIONAL: Alert, responds  appropriately to questions. Well-appearing; well-nourished HEAD: Normocephalic, atraumatic EYES: Conjunctivae clear, pupils appear equal, sclera nonicteric ENT: normal nose; moist mucous membranes NECK: Supple, normal ROM CARD: RRR; S1 and S2 appreciated RESP: Normal chest excursion without splinting or tachypnea; breath sounds clear and equal bilaterally; no wheezes, no rhonchi, no rales, no hypoxia or respiratory distress, speaking full sentences ABD/GI: Non-distended; soft, non-tender, no rebound, no guarding, no peritoneal signs BACK: The back appears normal EXT: Normal ROM in all joints; no deformity noted, no edema SKIN: Normal color for age and race; warm; no rash on exposed skin NEURO: Moves all extremities equally, normal speech PSYCH: The patient's mood and manner are appropriate.   ED Results / Procedures / Treatments   LABS: (all labs ordered are listed, but only abnormal results are displayed) Labs Reviewed  COMPREHENSIVE METABOLIC PANEL - Abnormal; Notable for the following components:      Result Value   Glucose, Bld 103 (*)    All other components within normal limits  URINALYSIS, ROUTINE W REFLEX MICROSCOPIC - Abnormal; Notable for the following components:   Color,  Urine YELLOW (*)    APPearance HAZY (*)    Ketones, ur 5 (*)    All other components within normal limits  CBC WITH DIFFERENTIAL/PLATELET - Abnormal; Notable for the following components:   Platelets 147 (*)    All other components within normal limits  LIPASE, BLOOD  PREGNANCY, URINE  CBC WITH DIFFERENTIAL/PLATELET     EKG:   RADIOLOGY: My personal review and interpretation of imaging: Ultrasound unremarkable.  I have personally reviewed all radiology reports.   US ABDOMEN LIMITED RUQ (LIVER/GB)  Result Date: 05/03/2023 CLINICAL DATA:  Upper abdominal pain for 3 days EXAM: ULTRASOUND ABDOMEN LIMITED RIGHT UPPER QUADRANT COMPARISON:  None Available. FINDINGS: Gallbladder: No gallstones or  wall thickening visualized. No sonographic Murphy sign noted by sonographer. Common bile duct: Diameter: 2 mm, normal Liver: No focal lesion identified. Within normal limits in parenchymal echogenicity. Portal vein is patent on color Doppler imaging with normal direction of blood flow towards the liver. Other: None. IMPRESSION: Normal examination. No evidence of cholelithiasis or acute cholecystitis. Electronically Signed   By: Burman Nieves M.D.   On: 05/03/2023 00:46     PROCEDURES:  Critical Care performed: No   CRITICAL CARE Performed by: Baxter Hire Vinaya Sancho   Total critical care time: 0 minutes  Critical care time was exclusive of separately billable procedures and treating other patients.  Critical care was necessary to treat or prevent imminent or life-threatening deterioration.  Critical care was time spent personally by me on the following activities: development of treatment plan with patient and/or surrogate as well as nursing, discussions with consultants, evaluation of patient's response to treatment, examination of patient, obtaining history from patient or surrogate, ordering and performing treatments and interventions, ordering and review of laboratory studies, ordering and review of radiographic studies, pulse oximetry and re-evaluation of patient's condition.   Procedures    IMPRESSION / MDM / ASSESSMENT AND PLAN / ED COURSE  I reviewed the triage vital signs and the nursing notes.    Patient here with upper abdominal pain after eating.   DIFFERENTIAL DIAGNOSIS (includes but not limited to):   Cholelithiasis, cholecystitis, pancreatitis, GERD, gastritis, peptic ulcer, H. pylori   Patient's presentation is most consistent with acute presentation with potential threat to life or bodily function.   PLAN: Labs obtained from triage.  Normal LFTs, lipase.  Pregnancy test negative.  Will obtain right upper quadrant ultrasound.  Will give Mylanta, Protonix for symptomatic  relief.   MEDICATIONS GIVEN IN ED: Medications  pantoprazole (PROTONIX) EC tablet 40 mg (0 mg Oral Hold 05/03/23 0034)  lidocaine (XYLOCAINE) 2 % viscous mouth solution 15 mL (0 mLs Mouth/Throat Hold 05/03/23 0118)  alum & mag hydroxide-simeth (MAALOX/MYLANTA) 200-200-20 MG/5ML suspension 30 mL (30 mLs Oral Given 05/03/23 0031)     ED COURSE: Right upper quadrant ultrasound reviewed and interpreted by myself and the radiologist and shows no acute abnormality.  Patient reports pain has improved somewhat and she is able to drink here without difficulty.  Recommended close follow-up with GI.  Will also place PCP referral.  Will discharge on PPI and Carafate, recommended diet changes.  Will also discharge with short course of narcotic pain medication.  At this time, I do not feel there is any life-threatening condition present. I reviewed all nursing notes, vitals, pertinent previous records.  All lab and urine results, EKGs, imaging ordered have been independently reviewed and interpreted by myself.  I reviewed all available radiology reports from any imaging ordered this visit.  Based on my assessment, I feel the patient is safe to be discharged home without further emergent workup and can continue workup as an outpatient as needed. Discussed all findings, treatment plan as well as usual and customary return precautions.  They verbalize understanding and are comfortable with this plan.  Outpatient follow-up has been provided as needed.  All questions have been answered.   CONSULTS:  none   OUTSIDE RECORDS REVIEWED: Reviewed last health department note on 10/08/2022.       FINAL CLINICAL IMPRESSION(S) / ED DIAGNOSES   Final diagnoses:  Upper abdominal pain     Rx / DC Orders   ED Discharge Orders          Ordered    Ambulatory Referral to Primary Care (Establish Care)        05/03/23 0054    sucralfate (CARAFATE) 1 GM/10ML suspension  4 times daily        05/03/23 0132     pantoprazole (PROTONIX) 40 MG tablet  Daily        05/03/23 0132    HYDROcodone-acetaminophen (NORCO/VICODIN) 5-325 MG tablet  Every 6 hours PRN        05/03/23 0132    ondansetron (ZOFRAN-ODT) 4 MG disintegrating tablet  Every 6 hours PRN        05/03/23 0132             Note:  This document was prepared using Dragon voice recognition software and may include unintentional dictation errors.   Chamille Werntz, Layla Maw, DO 05/03/23 [phone]

## 2023-05-03 NOTE — ED Notes (Signed)
Pt provided with apple juice at this time

## 2023-05-03 NOTE — Discharge Instructions (Addendum)
Please avoid NSAIDs such as aspirin (Goody powders), ibuprofen (Motrin, Advil), naproxen (Aleve) as these may worsen your symptoms.  Tylenol 1000 mg every 6 hours is safe to take as long as you have no history of liver problems (heavy alcohol use, cirrhosis, hepatitis).  Please avoid spicy, acidic (citrus fruits, tomato based sauces, salsa), greasy, fatty foods.  Please avoid caffeine and alcohol.  Smoking can also make GERD/acid reflux worse.  Over the counter medications such as TUMS, Maalox or Mylanta, pepcid, Prilosec or Nexium may help with your symptoms.  Do not take Prilosec or Nexium if you are already prescribed a proton pump inhibitor.   You are being provided a prescription for opiates (also known as narcotics) for pain control.  Opiates can be addictive and should only be used when absolutely necessary for pain control when other alternatives do not work.  We recommend you only use them for the recommended amount of time and only as prescribed.  Please do not take with other sedative medications or alcohol.  Please do not drive, operate machinery, make important decisions while taking opiates.  Please note that these medications can be addictive and have high abuse potential.  Patients can become addicted to narcotics after only taking them for a few days.  Please keep these medications locked away from children, teenagers or any family members with history of substance abuse.  Narcotic pain medicine may also make you constipated.  You may use over-the-counter medications such as MiraLAX, Colace to prevent constipation.  If you become constipated, you may use over-the-counter enemas as needed.  Itching and nausea are also common side effects of narcotic pain medication.  If you develop uncontrolled vomiting or a rash, please stop these medications and seek medical care.

## 2023-06-24 ENCOUNTER — Ambulatory Visit: Payer: Self-pay

## 2023-06-24 VITALS — BP 120/74 | Ht 64.0 in | Wt 120.0 lb

## 2023-06-24 DIAGNOSIS — Z3009 Encounter for other general counseling and advice on contraception: Secondary | ICD-10-CM

## 2023-06-24 DIAGNOSIS — Z3201 Encounter for pregnancy test, result positive: Secondary | ICD-10-CM

## 2023-06-24 LAB — PREGNANCY, URINE: Preg Test, Ur: POSITIVE — AB

## 2023-06-24 MED ORDER — PRENATAL 27-0.8 MG PO TABS: 1.0000 | ORAL_TABLET | Freq: Every day | ORAL | Status: DC

## 2023-06-24 NOTE — Progress Notes (Signed)
UPT positive. Patient plans to receive prenatal care at [facility].   The patient was dispensed prenatal vitamins #100 today. I provided counseling today regarding the medication. We discussed the medication, the side effects and when to call clinic.   Positive pregnancy packet reviewed and given to patient. Also counseled oh hydration and when to seek medical attention.   Patient given the opportunity to ask questions. Questions answered.   Abagail Kitchens, RN

## 2023-06-25 ENCOUNTER — Other Ambulatory Visit: Payer: Self-pay

## 2023-06-25 ENCOUNTER — Encounter: Payer: Self-pay | Admitting: Certified Registered"

## 2023-06-25 ENCOUNTER — Emergency Department
Admission: EM | Admit: 2023-06-25 | Discharge: 2023-06-25 | Disposition: A | Discharge status: Home / Self Care | Payer: Self-pay | Attending: Emergency Medicine | Admitting: Emergency Medicine

## 2023-06-25 ENCOUNTER — Emergency Department: Payer: Self-pay

## 2023-06-25 DIAGNOSIS — O208 Other hemorrhage in early pregnancy: Secondary | ICD-10-CM | POA: Insufficient documentation

## 2023-06-25 DIAGNOSIS — Z87891 Personal history of nicotine dependence: Secondary | ICD-10-CM | POA: Insufficient documentation

## 2023-06-25 DIAGNOSIS — Z3A01 Less than 8 weeks gestation of pregnancy: Secondary | ICD-10-CM | POA: Insufficient documentation

## 2023-06-25 DIAGNOSIS — O3680X0 Pregnancy with inconclusive fetal viability, not applicable or unspecified: Secondary | ICD-10-CM

## 2023-06-25 LAB — HCG, QUANTITATIVE, PREGNANCY: hCG, Beta Chain, Quant, S: 107 m[IU]/mL — ABNORMAL HIGH (ref <5)

## 2023-06-25 LAB — CBC
HCT: 37 % (ref 36.0–46.0)
Hemoglobin: 12.6 g/dL (ref 12.0–15.0)
MCH: 30.1 pg (ref 26.0–34.0)
MCHC: 34.1 g/dL (ref 30.0–36.0)
MCV: 88.5 fL (ref 80.0–100.0)
Platelets: 156 10*3/uL (ref 150–400)
RBC: 4.18 MIL/uL (ref 3.87–5.11)
RDW: 11.9 % (ref 11.5–15.5)
WBC: 6.7 10*3/uL (ref 4.0–10.5)
nRBC: 0 % (ref 0.0–0.2)

## 2023-06-25 LAB — URINALYSIS, ROUTINE W REFLEX MICROSCOPIC
Bilirubin Urine: NEGATIVE
Glucose, UA: NEGATIVE mg/dL
Ketones, ur: NEGATIVE mg/dL
Nitrite: NEGATIVE
Protein, ur: NEGATIVE mg/dL
Specific Gravity, Urine: 1.008 (ref 1.005–1.030)
pH: 8 (ref 5.0–8.0)

## 2023-06-25 LAB — BASIC METABOLIC PANEL
Anion gap: 8 (ref 5–15)
BUN: 8 mg/dL (ref 6–20)
CO2: 23 mmol/L (ref 22–32)
Calcium: 8.9 mg/dL (ref 8.9–10.3)
Chloride: 105 mmol/L (ref 98–111)
Creatinine, Ser: 0.58 mg/dL (ref 0.44–1.00)
GFR, Estimated: >60 mL/min (ref >60)
Glucose, Bld: 95 mg/dL (ref 70–99)
Potassium: 3.5 mmol/L (ref 3.5–5.1)
Sodium: 136 mmol/L (ref 135–145)

## 2023-06-25 LAB — ABO/RH: ABO/RH(D): B POS

## 2023-06-25 LAB — PREGNANCY, URINE: Preg Test, Ur: POSITIVE — AB

## 2023-06-25 SURGERY — LAPAROSCOPY DIAGNOSTIC
Anesthesia: General

## 2023-06-25 MED ORDER — BUPIVACAINE HCL (PF) 0.5 % IJ SOLN
INTRAMUSCULAR | Status: AC
  Filled 2023-06-25: qty 30

## 2023-06-25 MED ORDER — ACETAMINOPHEN 160 MG/5ML PO SOLN
650.0000 mg | Freq: Once | ORAL | Status: AC
  Administered 2023-06-25: 650 mg via ORAL
  Filled 2023-06-25: qty 20.3

## 2023-06-25 MED ORDER — METHOTREXATE FOR ECTOPIC PREGNANCY
50.0000 mg/m2 | Freq: Once | INTRAMUSCULAR | Status: AC
  Administered 2023-06-25: 77.5 mg via INTRAMUSCULAR
  Filled 2023-06-25: qty 3.1

## 2023-06-25 NOTE — ED Provider Notes (Signed)
Care of this patient assumed from prior physician at 1500 pending OB recommendations and disposition. Please see prior physician note for further details.  Briefly this is a 27 year old female G1 who presented with vaginal spotting.  Labs here with low beta-hCG at 107. Ultrasound with pregnancy of unknown location, though a small amount of complex fluid was noted around the right ovary concerning for possible ruptured ectopic pregnancy.  Dr. Logan Bores with OB/GYN was consulted.  Signed out to me pending OB recommendations.  Patient was evaluated by Dr Logan Bores.  He did discuss multiple options, but patient did wish to proceed with methotrexate which was ordered by [facility] team.  She was ordered for liquid Tylenol for pain control.  She will follow-up with OB team on days 4 and 7.  I did confirm this plan with the patient.  Strict return precautions provided for heavy vaginal bleeding, pelvic pain, lightheadedness.  Patient discharged in stable condition.   Trinna Post, MD 06/25/23 2020

## 2023-06-25 NOTE — ED Notes (Signed)
Pt to US.

## 2023-06-25 NOTE — ED Notes (Signed)
Called L&D for RN to come down to administer methotrexate at this time, states they will send someone momentarily

## 2023-06-25 NOTE — ED Triage Notes (Signed)
Pt [redacted] weeks pregnant, reports vaginal bleeding start today intermittently. Reports recent body aches.  Reports back pain. Denies urinary sx.

## 2023-06-25 NOTE — ED Provider Notes (Signed)
[facility] Provider Note    Event Date/Time   First MD Initiated Contact with Patient 06/25/23 1112     (approximate)   History   Vaginal Bleeding   HPI  Jacqueline Villarreal is a 27 y.o. female G1 who comes in with some vaginal spotting.  Patient reports that she has been with 1 partner for the past 8 months.  Reports recent STD testing that was all negative denies any vaginal discharge or concern for STDs.  She reports she had a positive pregnancy test yesterday and that she did then develop some vaginal spotting today.  Denies it being more than a pad an hour.  She denies any significant pain just a little bit of cramping.      Physical Exam   Triage Vital Signs: ED Triage Vitals  Encounter Vitals Group     BP 06/25/23 1049 133/65     Systolic BP Percentile --      Diastolic BP Percentile --      Pulse Rate 06/25/23 1049 (!) 116     Resp 06/25/23 1049 16     Temp 06/25/23 1049 98.2 F (36.8 C)     Temp src --      SpO2 06/25/23 1049 100 %     Weight 06/25/23 1050 120 lb (54.4 kg)     Height 06/25/23 1050 5\' 4"  (1.626 m)     Head Circumference --      Peak Flow --      Pain Score 06/25/23 1050 5     Pain Loc --      Pain Education --      Exclude from Growth Chart --     Most recent vital signs: Vitals:   06/25/23 1049  BP: 133/65  Pulse: (!) 116  Resp: 16  Temp: 98.2 F (36.8 C)  SpO2: 100%     General: Awake, no distress.  CV:  Good peripheral perfusion.  Resp:  Normal effort.  Abd:  No distention.  Soft and nontender Other:     ED Results / Procedures / Treatments   Labs (all labs ordered are listed, but only abnormal results are displayed) Labs Reviewed  CBC  BASIC METABOLIC PANEL  HCG, QUANTITATIVE, PREGNANCY  URINALYSIS, ROUTINE W REFLEX MICROSCOPIC    RADIOLOGY I have reviewed the ultrasound personally interpreted and no IUP  PROCEDURES:  Critical Care performed: No  Procedures   MEDICATIONS ORDERED IN  ED: Medications - No data to display   IMPRESSION / MDM / ASSESSMENT AND PLAN / ED COURSE  I reviewed the triage vital signs and the nursing notes.   Patient's presentation is most consistent with acute presentation with potential threat to life or bodily function.   Differential is miscarriage, pregnancy, subchorionic hemorrhage, ectopic.  Patient declining anything for pain.  Her urine is without evidence of UTI.  She has  positive does not need RhoGAM  Preg test was positive.  CBC shows stable hemoglobin.  BMP is normal.  Her hCG is 107  MPRESSION: *No intrauterine gestation seen and no adnexal mass is evident. These findings represent a pregnancy of unknown location. The sonographic differential diagnosis includes: An occult ectopic pregnancy, an intrauterine gestation too early to visualize or a spontaneous abortion. However, there is small amount of complex fluid surrounding the right ovary, concerning for blood products. These findings are concerning for ruptured ectopic pregnancy. Correlate clinically. Consider further evaluation with MRI pelvis.  Discussed with OB team and  Dr. Logan Bores is going to come down to evaluate patient.    FINAL CLINICAL IMPRESSION(S) / ED DIAGNOSES   Final diagnoses:  Pregnancy of unknown anatomic location     Rx / DC Orders   ED Discharge Orders     None        Note:  This document was prepared using Dragon voice recognition software and may include unintentional dictation errors.   Concha Se, MD 06/25/23 [phone]

## 2023-06-25 NOTE — Discharge Instructions (Addendum)
Please keep your scheduled follow-up with Dr. Logan Bores as directed.  Return to the ER for heavy vaginal bleeding including soaking through a pad every hour for more than 2 hours, severe abdominal or pelvic pain, lightheadedness or passing out or any other new or concerning symptoms.

## 2023-06-25 NOTE — Consult Note (Signed)
Consult Note  Requesting Attending Physician :  Trinna Post, MD Service Requesting Consult : @IPSERVICE @  Assessment/Recommendations:  Jacqueline Villarreal is a 27 y.o. female seen in consultation at the request of Trinna Post, MD regarding possible ectopic pregnancy.  A:  Possible ectopic pregnancy. (Pregnancy of unknown location)  Small amount fluid around ovary  P:     Methotrexate  FU quants day 4 and 7  Multiple warnings regarding pain and weakness and lightheadedness given   Obstetric History:  OB History  Gravida Para Term Preterm AB Living  2 0 0 0 1 0  SAB IAB Ectopic Multiple Live Births  1 0 0 0 0    # Outcome Date GA Lbr Len/2nd Weight Sex Type Anes PTL Lv  2 Current           1 SAB             Social History: Social History   Socioeconomic History   Marital status: Single    Spouse name: Not on file   Number of children: 0   Years of education: Not on file   Highest education level: Not on file  Occupational History   Not on file  Tobacco Use   Smoking status: Former    Types: E-cigarettes   Smokeless tobacco: Never   Tobacco comments:    "I vape every day all day."   Vaping Use   Vaping status: Every Day   Substances: Nicotine, Flavoring  Substance and Sexual Activity   Alcohol use: Not Currently    Alcohol/week: 1.0 standard drink of alcohol    Types: 1 Glasses of wine per week    Comment: occasionally   Drug use: No   Sexual activity: Yes    Birth control/protection: Pill, Condom, Implant    Comment: last OCP over 1 yr ago  Other Topics Concern   Not on file  Social History Narrative   Not on file   Social Drivers of Health   Financial Resource Strain: Not on file  Food Insecurity: Not on file  Transportation Needs: Not on file  Physical Activity: Not on file  Stress: Not on file  Social Connections: Not on file    Family History: family history includes Cancer in her paternal grandmother; Heart block in her mother; Irritable  bowel syndrome in her paternal grandmother.  Review of Systems: Has mild to moderate back pain   HOPI:   Pt presented to ED with mild back pain positive pregnancy test and small amount vaginal bleeding.   Objective :  Vital signs in last 24 hours: Temp:  [98.2 F (36.8 C)] 98.2 F (36.8 C) (12/13 1049) Pulse Rate:  [116] 116 (12/13 1049) Resp:  [16] 16 (12/13 1049) BP: (133)/(65) 133/65 (12/13 1049) SpO2:  [100 %] 100 % (12/13 1049) Weight:  [54.4 kg] 54.4 kg (12/13 1050)  Intake/Output last 3 shifts: No intake/output data recorded.   Physical Exam: Abd:  soft nontender   [place]:   See report.     No pregnancy noted.  Small amount fluid around ovary -  minimal fluid in pelvis   Reviewed directly with radiology and blood amount is small.  Likely stable.  Possibly blood.  We have discussed multiple options and multiple possibilities for this pregnancy.  It could be an intrauterine pregnancy but too small to identify.  It could be a very small ectopic pregnancy within the tube and she may be having a small amount of bleeding out  of the tube.  She could have aborted the pregnancy out the tube.  The fluid around the ovary may be blood but appears to be stable and there is very limited fluid in the pelvis indicating it is unlikely that she has significant hemoperitoneum and ongoing bleeding. Her quantitative beta-hCG is 100. I have given her the option of expectant management in the case where she might want to continue an intrauterine pregnancy that has not yet been visualized.   I have given her the option of surgery to try to identify if there is any bleeding and stop it which would then have to be followed by methotrexate because it is extremely unlikely to find an ectopic pregnancy with a quant of 100. I have given her the option of methotrexate and close follow-up but have stressed the fact that she could be having some bleeding and despite receiving methotrexate this intra  abdominal bleeding could get worse. Warnings regarding significantly worsening pelvic pain and lightheadedness.  I have asked her to represent to the emergency department should she have any of these findings.  She has elected to receive methotrexate with follow-up day 4 and day 7 as usual. I have answered all of her questions.  In fact I explained all the options and all the possibilities and then left her and her partner to discuss this for over 1/2-hour.  Upon my return they have decided upon methotrexate.   Elonda Husky, M.D. 06/25/2023 4:18 PM

## 2023-06-27 ENCOUNTER — Other Ambulatory Visit: Payer: Self-pay

## 2023-06-27 ENCOUNTER — Emergency Department
Admission: EM | Admit: 2023-06-27 | Discharge: 2023-06-27 | Disposition: A | Discharge status: Home / Self Care | Payer: Self-pay | Attending: Student in an Organized Health Care Education/Training Program | Admitting: Student in an Organized Health Care Education/Training Program

## 2023-06-27 DIAGNOSIS — N939 Abnormal uterine and vaginal bleeding, unspecified: Secondary | ICD-10-CM | POA: Insufficient documentation

## 2023-06-27 LAB — BASIC METABOLIC PANEL
Anion gap: 6 (ref 5–15)
BUN: 10 mg/dL (ref 6–20)
CO2: 25 mmol/L (ref 22–32)
Calcium: 9.3 mg/dL (ref 8.9–10.3)
Chloride: 106 mmol/L (ref 98–111)
Creatinine, Ser: 0.61 mg/dL (ref 0.44–1.00)
GFR, Estimated: >60 mL/min (ref >60)
Glucose, Bld: 127 mg/dL — ABNORMAL HIGH (ref 70–99)
Potassium: 3.7 mmol/L (ref 3.5–5.1)
Sodium: 137 mmol/L (ref 135–145)

## 2023-06-27 LAB — HCG, QUANTITATIVE, PREGNANCY: hCG, Beta Chain, Quant, S: 43 m[IU]/mL — ABNORMAL HIGH (ref <5)

## 2023-06-27 LAB — CBC
HCT: 39 % (ref 36.0–46.0)
Hemoglobin: 13.7 g/dL (ref 12.0–15.0)
MCH: 30.2 pg (ref 26.0–34.0)
MCHC: 35.1 g/dL (ref 30.0–36.0)
MCV: 86.1 fL (ref 80.0–100.0)
Platelets: 196 10*3/uL (ref 150–400)
RBC: 4.53 MIL/uL (ref 3.87–5.11)
RDW: 11.7 % (ref 11.5–15.5)
WBC: 5.7 10*3/uL (ref 4.0–10.5)
nRBC: 0 % (ref 0.0–0.2)

## 2023-06-27 MED ORDER — ACETAMINOPHEN 160 MG/5ML PO SOLN
500.0000 mg | Freq: Once | ORAL | Status: AC
  Administered 2023-06-27: 500 mg via ORAL
  Filled 2023-06-27: qty 20.3

## 2023-06-27 NOTE — ED Triage Notes (Signed)
Pt comes with c/o ectopic pregn the patient was seen here on the 13 and given the shot. Pt states she had been being ok until today the pain started in her pelvic area. Pt states some vaginally bleeding with clots and is pretty heavy.

## 2023-06-27 NOTE — Discharge Instructions (Signed)
Keep your appointment with Dr. Logan Bores as already discussed.

## 2023-06-27 NOTE — ED Provider Notes (Signed)
[facility] Provider Note    Event Date/Time   First MD Initiated Contact with Patient 06/27/23 1246     (approximate)   History   Abdominal Pain and Back Pain   HPI  IBBIE MERE is a 27 y.o. female   presents to the ED with complaint of vaginal bleeding and also cramping.  She was seen in the emergency department on 06/25/2023 at which time she was given methotrexate and arrangements worse made for her to see Dr. Logan Bores.  Patient was told that if her bleeding worsens she is to return to the emergency department.  Patient has had 5 mL of Tylenol earlier this morning without any relief of her cramping.  In looking through her records her hCG at that time had been 107 and ultrasound did not show an embryo/fetus/ectopic as it was too early.      Physical Exam   Triage Vital Signs: ED Triage Vitals  Encounter Vitals Group     BP 06/27/23 1131 (!) 144/77     Systolic BP Percentile --      Diastolic BP Percentile --      Pulse Rate 06/27/23 1131 92     Resp 06/27/23 1131 18     Temp 06/27/23 1131 98 F (36.7 C)     Temp src --      SpO2 06/27/23 1131 100 %     Weight 06/27/23 1132 119 lb 9 oz (54.2 kg)     Height 06/27/23 1132 5\' 4"  (1.626 m)     Head Circumference --      Peak Flow --      Pain Score 06/27/23 1132 8     Pain Loc --      Pain Education --      Exclude from Growth Chart --     Most recent vital signs: Vitals:   06/27/23 1131  BP: (!) 144/77  Pulse: 92  Resp: 18  Temp: 98 F (36.7 C)  SpO2: 100%     General: Awake, no distress.  CV:  Good peripheral perfusion.  Resp:  Normal effort.  Abd:  No distention.  Other:     ED Results / Procedures / Treatments   Labs (all labs ordered are listed, but only abnormal results are displayed) Labs Reviewed  HCG, QUANTITATIVE, PREGNANCY - Abnormal; Notable for the following components:      Result Value   hCG, Beta Chain, Quant, S 43 (*)    All other components within  normal limits  BASIC METABOLIC PANEL - Abnormal; Notable for the following components:   Glucose, Bld 127 (*)    All other components within normal limits  CBC  POC URINE PREG, ED     PROCEDURES:  Critical Care performed:   Procedures   MEDICATIONS ORDERED IN ED: Medications  acetaminophen (TYLENOL) 160 MG/5ML solution 500 mg (500 mg Oral Given 06/27/23 1347)     IMPRESSION / MDM / ASSESSMENT AND PLAN / ED COURSE  I reviewed the triage vital signs and the nursing notes.   Differential diagnosis includes, but is not limited to, vaginal bleeding after methotrexate, cramping, repeat hCG.  27 year old female presents to the ED with complaint of vaginal bleeding and for a repeat beta-hCG.  She was reassured with the beta-hCG from 107 down to 43.  It was explained to her that the methotrexate she most likely would see some vaginal bleeding.  Currently she is going through 1 pad every 2  hours.  An insufficient amount of analgesic was taken but patient was offered a narcotic pain medication which she declined stating that most medications makes her feel funny.  She still plans on keeping her appointment with Dr. Logan Bores.  Tylenol elixir was given at an appropriate dose for her.  She was feeling much better and reports that her last trip to the bathroom produced a large piece of tissue and now feeling better.    Clinical Course as of 06/27/23 1513  Sun Jun 27, 2023  1312 HCG, Beta Chain, [place] Longest(!): [place] [RS]    Clinical Course User Index [RS] Tommi Rumps, PA-C   Patient's presentation is most consistent with acute illness / injury with system symptoms.  FINAL CLINICAL IMPRESSION(S) / ED DIAGNOSES   Final diagnoses:  Vaginal bleeding     Rx / DC Orders   ED Discharge Orders     None        Note:  This document was prepared using Dragon voice recognition software and may include unintentional dictation errors.   Tommi Rumps, PA-C 06/27/23 1513     Willy Eddy, MD 06/28/23 [phone]

## 2023-06-29 ENCOUNTER — Other Ambulatory Visit: Payer: Self-pay

## 2023-06-29 DIAGNOSIS — O008 Other ectopic pregnancy without intrauterine pregnancy: Secondary | ICD-10-CM

## 2023-06-30 ENCOUNTER — Encounter: Payer: Self-pay | Admitting: Obstetrics and Gynecology

## 2023-06-30 ENCOUNTER — Other Ambulatory Visit: Payer: Self-pay

## 2023-06-30 DIAGNOSIS — Z7689 Persons encountering health services in other specified circumstances: Secondary | ICD-10-CM

## 2023-06-30 LAB — BETA HCG QUANT (REF LAB): hCG Quant: 14 m[IU]/mL

## 2023-07-02 ENCOUNTER — Other Ambulatory Visit: Payer: Self-pay

## 2023-07-02 DIAGNOSIS — O008 Other ectopic pregnancy without intrauterine pregnancy: Secondary | ICD-10-CM

## 2023-07-03 LAB — BETA HCG QUANT (REF LAB): hCG Quant: 4 m[IU]/mL

## 2023-07-05 ENCOUNTER — Encounter: Payer: Self-pay | Admitting: Licensed Practical Nurse

## 2023-07-09 ENCOUNTER — Telehealth: Payer: Self-pay | Admitting: Licensed Practical Nurse

## 2023-07-14 NOTE — L&D Delivery Note (Signed)
 Delivery Note   Jacqueline Villarreal is a 28 y.o. G2P0010 at [redacted]w[redacted]d Estimated Date of Delivery: 05/09/24  PRE-OPERATIVE DIAGNOSIS:  1) [redacted]w[redacted]d pregnancy.    POST-OPERATIVE DIAGNOSIS:  1) [redacted]w[redacted]d pregnancy s/p Vaginal, Spontaneous 2) 2nd degree laceration, repaired. Labial laceration, repaired  Delivery Type: Vaginal, Spontaneous   Delivery Anesthesia: Epidural  Labor Complications:none    ESTIMATED BLOOD LOSS: 400 ml    FINDINGS:   1) female infant, Jace, Apgar scores of 8   at 1 minute and 9   at 5 minutes and a birthweight of 8 lbs., 5 ounces.     SPECIMENS:   PLACENTA:   Appearance: Intact   Removal: Spontaneous     Disposition: per protocol  CORD BLOOD: Not Indicated  DISPOSITION:  Infant left in stable condition in the delivery room, with L&D personnel and mother  NARRATIVE SUMMARY: Labor course:  Jacqueline Villarreal is a G2P0010 at [redacted]w[redacted]d who presented to Labor & Delivery for labor. Her initial cervical exam was 3/80/-1. Labor proceeded spontaneously, and she was found to be completely dilated at 1930. She pushed with good effort but did begin to get fatigued and discouraged. She was bringing the baby to +3 station with pushes but was having difficulty maintaining focus with her pushing efforts. A prolonged decel to the 90s occurred and Dr. Verdon was called to the bedside to consult for an operative delivery. However, over the next 20 minutes, the FHR recovered and Jacqueline Villarreal wa able to bring the baby to a small crown. She then delivered the baby DOA->LOA. There was a loose nuchal cord that was reduced on the perineum. The shoulders were birthed without difficulty. The infant was placed skin-to-skin with Jacqueline Villarreal. Pitocin was infused for AMTSL.The cord was doubly clamped and cut when pulsations ceased by the father. The placenta delivered spontaneously and was noted to be intact with a 3VC. A perineal and vaginal examination was performed. Moderate bleeding from the laceration was noted and  TXA was administered.  Episiotomy/Lacerations: 2nd degree;Labial Lacerations were repaired with Vicryl suture using epidural) anesthesia. Jacqueline Villarreal tolerated this well. Mother and baby were left in stable condition.   Eleanor Canny, CNM 05/12/2024 12:44 AM

## 2023-07-16 ENCOUNTER — Encounter: Payer: Self-pay | Admitting: Licensed Practical Nurse

## 2023-07-19 NOTE — Telephone Encounter (Signed)
No additional information

## 2023-07-28 ENCOUNTER — Encounter: Payer: Self-pay | Admitting: Licensed Practical Nurse

## 2023-08-31 ENCOUNTER — Other Ambulatory Visit: Payer: Self-pay

## 2023-08-31 ENCOUNTER — Telehealth: Payer: Self-pay

## 2023-08-31 DIAGNOSIS — Z8759 Personal history of other complications of pregnancy, childbirth and the puerperium: Secondary | ICD-10-CM

## 2023-08-31 NOTE — Telephone Encounter (Signed)
 Pt called triage requesting a beta hcg. She states she had an ectopic in  December. She now has a positive pregnancy test and her lmp would have been 1/16 or 19. I told pt I would ask if orders can be placed or should she wait. Please advise.

## 2023-09-01 LAB — BETA HCG QUANT (REF LAB): hCG Quant: 19 m[IU]/mL

## 2023-09-01 NOTE — Addendum Note (Signed)
 Addended by: Kathlene Cote on: 09/01/2023 10:08 AM   Modules accepted: Orders

## 2023-09-01 NOTE — Telephone Encounter (Signed)
 Patient transferred from front desk to discuss beta results. She is concerned (tearful) as she had an ectopic in December. Advised beta results indicate that she is pregnant. Not indicative of ectopic. She states she is having some mild cramping in her "V Line" (groin area) on both sides. Reviewed mild cramping normal in early pregnancy as well as spotting. Also advised when to report to ED. Ectopic Information sent via my chart for her to review. Repeat beta scheduled for Thursday 09/02/23.

## 2023-09-02 ENCOUNTER — Other Ambulatory Visit: Payer: Self-pay

## 2023-09-02 ENCOUNTER — Emergency Department: Admission: EM | Admit: 2023-09-02 | Discharge: 2023-09-02 | Discharge status: Against Medical Advice | Payer: Self-pay

## 2023-09-02 ENCOUNTER — Telehealth: Payer: Self-pay | Admitting: Certified Nurse Midwife

## 2023-09-02 NOTE — Telephone Encounter (Signed)
 Reached out to pt to reschedule lab appt that was scheduled on 09/02/2023 at 2:40.  Pt states that she was on her way to the [facility] Emergency Room.  She states that she will call back to reschedule.

## 2023-09-03 ENCOUNTER — Other Ambulatory Visit: Payer: Self-pay | Admitting: Obstetrics

## 2023-09-03 ENCOUNTER — Telehealth: Payer: Self-pay

## 2023-09-03 MED ORDER — FOLIC ACID 1 MG PO TABS
4.0000 mg | ORAL_TABLET | Freq: Every day | ORAL | 8 refills | Status: DC
Start: 2023-09-03 — End: 2023-12-13

## 2023-09-03 NOTE — Telephone Encounter (Signed)
 Reached out to pt (2x) to reschedule lab appt that was scheduled on 09/02/2023 at 2:40.  Was able to reschedule lab appt to 09/05/2022 at 8:00.

## 2023-09-03 NOTE — Telephone Encounter (Signed)
 TRIAGE VOICEMAIL: Patient states she went to [facility] ER last night. She advised the OB team wants her to take 4 g of Folic Acid as soon as possible. 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 The OB Team did not write a rx for it. Inquiring if she can get a rx.

## 2023-09-03 NOTE — Telephone Encounter (Signed)
 Patient aware.

## 2023-09-03 NOTE — Telephone Encounter (Signed)
 I could not find a specific 4 mg folic acid, but I sent over a prescription for 1 mg Folvite and she can take 4 tabs if that is the recommendation.

## 2023-09-06 ENCOUNTER — Telehealth: Payer: Self-pay

## 2023-09-06 ENCOUNTER — Other Ambulatory Visit: Payer: Self-pay

## 2023-09-06 DIAGNOSIS — Z8759 Personal history of other complications of pregnancy, childbirth and the puerperium: Secondary | ICD-10-CM

## 2023-09-06 NOTE — Telephone Encounter (Signed)
 Left voicemail advising to check my chart call back to speak to after hours nurse for additional advice if needed. Otherwise, our phones will be back on tomorrow at 8:15 am. Information sent via my chart.

## 2023-09-06 NOTE — Telephone Encounter (Signed)
 TRIAGE VOICEMAIL: Patient states she had labs today. She also spoke with an OB at the hospital she went to the other day, but she's not super concerned about the shoulder pain that she is having. It's not necessarily in her shoulder, it's more in her trapezius muscle. She just wanted to make sure that was something she didn't need to be concerned about.

## 2023-09-07 LAB — BETA HCG QUANT (REF LAB): hCG Quant: 500 m[IU]/mL

## 2023-09-08 ENCOUNTER — Other Ambulatory Visit: Payer: Self-pay | Admitting: Certified Nurse Midwife

## 2023-09-08 ENCOUNTER — Encounter: Payer: Self-pay | Admitting: Certified Nurse Midwife

## 2023-09-08 DIAGNOSIS — O3680X0 Pregnancy with inconclusive fetal viability, not applicable or unspecified: Secondary | ICD-10-CM

## 2023-09-16 MED ORDER — CEPHALEXIN 250 MG/5ML PO SUSR: 1000.0000 mg | Freq: Two times a day (BID) | ORAL | 0 refills | Status: AC

## 2023-09-23 ENCOUNTER — Other Ambulatory Visit: Payer: Self-pay | Admitting: Certified Nurse Midwife

## 2023-09-23 DIAGNOSIS — R300 Dysuria: Secondary | ICD-10-CM

## 2023-09-24 ENCOUNTER — Ambulatory Visit: Payer: Self-pay

## 2023-09-24 DIAGNOSIS — O3680X0 Pregnancy with inconclusive fetal viability, not applicable or unspecified: Secondary | ICD-10-CM

## 2023-10-01 ENCOUNTER — Telehealth

## 2023-10-01 DIAGNOSIS — Z3480 Encounter for supervision of other normal pregnancy, unspecified trimester: Secondary | ICD-10-CM | POA: Insufficient documentation

## 2023-10-01 DIAGNOSIS — Z3689 Encounter for other specified antenatal screening: Secondary | ICD-10-CM

## 2023-10-01 DIAGNOSIS — Z3490 Encounter for supervision of normal pregnancy, unspecified, unspecified trimester: Secondary | ICD-10-CM | POA: Insufficient documentation

## 2023-10-01 NOTE — Progress Notes (Signed)
 New OB Intake  I connected with  Jacqueline Villarreal on 10/01/23 at  2:15 PM EDT by  Video Visit and verified that I am speaking with the correct person using two identifiers. Nurse is located at [facility] and pt is located at car.  I discussed the limitations, risks, security and privacy concerns of performing an evaluation and management service by telephone and the availability of in person appointments. I also discussed with the patient that there may be a patient responsible charge related to this service. The patient expressed understanding and agreed to proceed.  I explained I am completing New OB Intake today. We discussed her EDD of 05/13/2024 that is based on irregular LMP . Pt is G2/P0. I reviewed her allergies, medications, Medical/Surgical/OB history, and appropriate screenings. There are cats in the home  no . Based on history, this is a/an pregnancy uncomplicated . Her obstetrical history is significant for  she is currently uses her vape daily but she is decreasing the use .  Patient Active Problem List   Diagnosis Date Noted   Supervision of other normal pregnancy, antepartum 10/01/2023   Ovarian cyst 03/24/2022   Anxiety about health 10/24/2021    Concerns addressed today:   Delivery Plans:  Plans to deliver at [facility].  Anatomy US Explained first scheduled [place] will be around 19 weeks.    Labs Discussed genetic screening with patient. Patient would like to do genetic testing to be drawn at new OB visit. Discussed possible labs to be drawn at new OB appointment.  COVID Vaccine Patient has not had COVID vaccine.   Social Determinants of Health Food Insecurity: denies food insecurity WIC Referral: Patient is interested in referral to [facility].  Transportation: Patient denies transportation needs. Childcare: Discussed no children allowed at ultrasound appointments.   First visit review I reviewed new OB appt with pt. I explained she will have blood  work and pap smear/pelvic exam if indicated. Explained pt will be seen by Doreene Burke CNM at first visit; encounter routed to appropriate provider.   Loney Laurence, CMA 10/01/2023  2:55 PM

## 2023-10-28 ENCOUNTER — Telehealth: Payer: Self-pay

## 2023-10-28 NOTE — Telephone Encounter (Signed)
 Patient LVM on Triage stating concerns of headache starting last night. She reports taking tylenol (children's strength) with mild relief. We discussed the importance of hydration, eating protein rich foods, use of adult strength tylenol, incorporating an allergy pill and checking her BP tomorrow at work. Discussed warning signs and when to call or go to ED. Patient states understanding.

## 2023-11-05 ENCOUNTER — Encounter: Payer: Self-pay | Admitting: Certified Nurse Midwife

## 2023-11-05 ENCOUNTER — Ambulatory Visit (INDEPENDENT_AMBULATORY_CARE_PROVIDER_SITE_OTHER): Admitting: Certified Nurse Midwife

## 2023-11-05 ENCOUNTER — Other Ambulatory Visit (HOSPITAL_COMMUNITY)
Admission: RE | Admit: 2023-11-05 | Discharge: 2023-11-05 | Disposition: A | Discharge status: Home / Self Care | Source: Ambulatory Visit | Attending: Certified Nurse Midwife | Admitting: Certified Nurse Midwife

## 2023-11-05 VITALS — BP 116/73 | HR 71 | Wt 115.3 lb

## 2023-11-05 DIAGNOSIS — T7589XA Other specified effects of external causes, initial encounter: Secondary | ICD-10-CM

## 2023-11-05 DIAGNOSIS — Z3481 Encounter for supervision of other normal pregnancy, first trimester: Secondary | ICD-10-CM | POA: Diagnosis not present

## 2023-11-05 DIAGNOSIS — Z113 Encounter for screening for infections with a predominantly sexual mode of transmission: Secondary | ICD-10-CM | POA: Insufficient documentation

## 2023-11-05 DIAGNOSIS — Z1379 Encounter for other screening for genetic and chromosomal anomalies: Secondary | ICD-10-CM

## 2023-11-05 DIAGNOSIS — Z3A12 12 weeks gestation of pregnancy: Secondary | ICD-10-CM | POA: Diagnosis present

## 2023-11-05 DIAGNOSIS — Z0184 Encounter for antibody response examination: Secondary | ICD-10-CM

## 2023-11-05 DIAGNOSIS — Z0283 Encounter for blood-alcohol and blood-drug test: Secondary | ICD-10-CM

## 2023-11-05 DIAGNOSIS — Z130 Encounter for screening for diseases of the blood and blood-forming organs and certain disorders involving the immune mechanism: Secondary | ICD-10-CM

## 2023-11-05 LAB — OB RESULTS CONSOLE VARICELLA ZOSTER ANTIBODY, IGG: Varicella: IMMUNE

## 2023-11-05 NOTE — Progress Notes (Signed)
 NEW OB HISTORY AND PHYSICAL  SUBJECTIVE:       Jacqueline Villarreal is a 28 y.o. G45P0010 female, Patient's last menstrual period was 05/20/2023., Estimated Date of Delivery: 05/13/24, [redacted]w[redacted]d, presents today for establishment of Prenatal Care. She has no unusual complaints   Relationship: female partner ( FOB) Living: with her partner and his daughter ( Ella-autistic) Work : FT Tour manager Exercise:none Alcohol/Smoking/drugs/Vape:  vapes x 10 yrs trying to cut back   Gynecologic History Patient's last menstrual period was 05/20/2023. Normal Contraception: none Last Pap: has not had. Declines during pregnancy.   Obstetric History OB History  Gravida Para Term Preterm AB Living  2 0 0 0 1 0  SAB IAB Ectopic Multiple Live Births  1 0 0 0 0    # Outcome Date GA Lbr Len/2nd Weight Sex Type Anes PTL Lv  2 Current           1 SAB 2024 [redacted]w[redacted]d           Past Medical History:  Diagnosis Date   Ovarian cyst     Past Surgical History:  Procedure Laterality Date   MOUTH SURGERY     when pt had braces    Current Outpatient Medications on File Prior to Visit  Medication Sig Dispense Refill   folic acid  (FOLVITE ) 1 MG tablet Take 4 tablets (4 mg total) by mouth daily. 120 tablet 8   Prenatal Vit-Fe Fumarate-FA (PRENATAL MULTIVITAMIN) TABS tablet Take 1 tablet by mouth daily at 12 noon.     No current facility-administered medications on file prior to visit.    No Known Allergies  Social History   Socioeconomic History   Marital status: Significant Other    Spouse name: issac   Number of children: 0   Years of education: Not on file   Highest education level: GED or equivalent  Occupational History   Not on file  Tobacco Use   Smoking status: Former    Types: E-cigarettes   Smokeless tobacco: Never   Tobacco comments:    "I vape every day but am decreasing the use"  Vaping Use   Vaping status: Every Day   Substances: Nicotine , Flavoring  Substance and Sexual Activity    Alcohol use: Not Currently    Alcohol/week: 1.0 standard drink of alcohol    Types: 1 Glasses of wine per week    Comment: occasionally   Drug use: No   Sexual activity: Yes    Birth control/protection: Pill  Other Topics Concern   Not on file  Social History Narrative   Not on file   Social Drivers of Health   Financial Resource Strain: Low Risk  (10/01/2023)   Overall Financial Resource Strain (CARDIA)    Difficulty of Paying Living Expenses: Not very hard  Food Insecurity: No Food Insecurity (10/01/2023)   Hunger Vital Sign    Worried About Running Out of Food in the Last Year: Never true    Ran Out of Food in the Last Year: Never true  Transportation Needs: No Transportation Needs (10/01/2023)   PRAPARE - Administrator, Civil Service (Medical): No    Lack of Transportation (Non-Medical): No  Physical Activity: Sufficiently Active (10/01/2023)   Exercise Vital Sign    Days of Exercise per Week: 5 days    Minutes of Exercise per Session: 40 min  Stress: No Stress Concern Present (10/01/2023)   [employer] of Occupational Health - Occupational Stress Questionnaire  Feeling of Stress : Not at all  Social Connections: Moderately Integrated (10/01/2023)   Social Connection and Isolation Panel [NHANES]    Frequency of Communication with Friends and Family: More than three times a week    Frequency of Social Gatherings with Friends and Family: More than three times a week    Attends Religious Services: 1 to 4 times per year    Active Member of [employer] or Organizations: No    Attends Banker Meetings: Never    Marital Status: Living with partner  Intimate Partner Violence: Not At Risk (06/24/2023)   Humiliation, Afraid, Rape, and Kick questionnaire    Fear of Current or Ex-Partner: No    Emotionally Abused: No    Physically Abused: No    Sexually Abused: No    Family History  Problem Relation Age of Onset   Heart block Mother    Diabetes Mother     Diabetes Brother    Cancer Paternal Grandmother    Irritable bowel syndrome Paternal Grandmother    Breast cancer Paternal Aunt     The following portions of the patient's history were reviewed and updated as appropriate: allergies, current medications, past OB history, past medical history, past surgical history, past family history, past social history, and problem list.    OBJECTIVE: Initial Physical Exam (New OB)  GENERAL APPEARANCE: alert, well appearing, in no apparent distress, oriented to person, place and time HEAD: normocephalic, atraumatic MOUTH: mucous membranes moist, pharynx normal without lesions THYROID: no thyromegaly or masses present BREASTS: no masses noted, no significant tenderness, no palpable axillary nodes, no skin changes LUNGS: clear to auscultation, no wheezes, rales or rhonchi, symmetric air entry HEART: regular rate and rhythm, no murmurs ABDOMEN: soft, nontender, nondistended, no abnormal masses, no epigastric pain EXTREMITIES: no redness or tenderness in the calves or thighs SKIN: normal coloration and turgor, no rashes LYMPH NODES: no adenopathy palpable NEUROLOGIC: alert, oriented, normal speech, no focal findings or movement disorder noted  PELVIC EXAM EXTERNAL GENITALIA: normal appearing vulva with no masses, tenderness or lesions VAGINA: no abnormal discharge or lesions CERVIX: no lesions or cervical motion tenderness UTERUS: gravid ADNEXA: no masses palpable and nontender OB EXAM PELVIMETRY: appears adequate RECTUM: exam not indicated  ASSESSMENT: Normal pregnancy  PLAN: Prenatal care See ordersNew OB counseling: The patient has been given an overview regarding routine prenatal care. Recommendations regarding diet, weight gain, and exercise in pregnancy were given. Prenatal testing, optional genetic testing, carrier screening, and ultrasound use in pregnancy were reviewed. Benefits of Breast Feeding were discussed. Plans bottle feed.  The patient is encouraged to consider nursing her baby post partum.  Alise Appl, CNM

## 2023-11-06 LAB — MICROSCOPIC EXAMINATION
Bacteria, UA: NONE SEEN
Casts: NONE SEEN /LPF
RBC, Urine: NONE SEEN /HPF (ref 0–2)

## 2023-11-06 LAB — URINALYSIS, ROUTINE W REFLEX MICROSCOPIC
Bilirubin, UA: NEGATIVE
Glucose, UA: NEGATIVE
Ketones, UA: NEGATIVE
Nitrite, UA: NEGATIVE
Protein,UA: NEGATIVE
RBC, UA: NEGATIVE
Specific Gravity, UA: 1.01 (ref 1.005–1.030)
Urobilinogen, Ur: 0.2 mg/dL (ref 0.2–1.0)
pH, UA: 7 (ref 5.0–7.5)

## 2023-11-07 LAB — CULTURE, OB URINE

## 2023-11-07 LAB — URINE CULTURE, OB REFLEX

## 2023-11-08 LAB — MONITOR DRUG PROFILE 14(MW)
Amphetamine Scrn, Ur: NEGATIVE ng/mL
BARBITURATE SCREEN URINE: NEGATIVE ng/mL
BENZODIAZEPINE SCREEN, URINE: NEGATIVE ng/mL
Buprenorphine, Urine: NEGATIVE ng/mL
CANNABINOIDS UR QL SCN: NEGATIVE ng/mL
Cocaine (Metab) Scrn, Ur: NEGATIVE ng/mL
Creatinine(Crt), U: 31.7 mg/dL (ref 20.0–300.0)
Fentanyl, Urine: NEGATIVE pg/mL
Meperidine Screen, Urine: NEGATIVE ng/mL
Methadone Screen, Urine: NEGATIVE ng/mL
OXYCODONE+OXYMORPHONE UR QL SCN: NEGATIVE ng/mL
Opiate Scrn, Ur: NEGATIVE ng/mL
Ph of Urine: 6.5 (ref 4.5–8.9)
Phencyclidine Qn, Ur: NEGATIVE ng/mL
Propoxyphene Scrn, Ur: NEGATIVE ng/mL
SPECIFIC GRAVITY: 1.009
Tramadol Screen, Urine: NEGATIVE ng/mL

## 2023-11-08 LAB — NICOTINE SCREEN, URINE: Cotinine Ql Scrn, Ur: POSITIVE ng/mL — AB

## 2023-11-09 LAB — CERVICOVAGINAL ANCILLARY ONLY
Chlamydia: NEGATIVE
Comment: NEGATIVE
Comment: NORMAL
Neisseria Gonorrhea: NEGATIVE

## 2023-11-11 LAB — CBC/D/PLT+RPR+RH+ABO+RUBIGG...
Antibody Screen: NEGATIVE
Basophils Absolute: 0 10*3/uL (ref 0.0–0.2)
Basos: 0 %
EOS (ABSOLUTE): 0.1 10*3/uL (ref 0.0–0.4)
Eos: 1 %
HCV Ab: NONREACTIVE
HIV Screen 4th Generation wRfx: REACTIVE
Hematocrit: 37.7 % (ref 34.0–46.6)
Hemoglobin: 13 g/dL (ref 11.1–15.9)
Hepatitis B Surface Ag: NEGATIVE
Immature Grans (Abs): 0 10*3/uL (ref 0.0–0.1)
Immature Granulocytes: 0 %
Lymphocytes Absolute: 1.1 10*3/uL (ref 0.7–3.1)
Lymphs: 18 %
MCH: 30.4 pg (ref 26.6–33.0)
MCHC: 34.5 g/dL (ref 31.5–35.7)
MCV: 88 fL (ref 79–97)
Monocytes Absolute: 0.4 10*3/uL (ref 0.1–0.9)
Monocytes: 6 %
Neutrophils Absolute: 4.5 10*3/uL (ref 1.4–7.0)
Neutrophils: 75 %
Platelets: 153 10*3/uL (ref 150–450)
RBC: 4.28 x10E6/uL (ref 3.77–5.28)
RDW: 12.6 % (ref 11.7–15.4)
RPR Ser Ql: NONREACTIVE
Rh Factor: POSITIVE
Rubella Antibodies, IGG: 1.4 {index} (ref >0.99)
Varicella zoster IgG: REACTIVE
WBC: 6 10*3/uL (ref 3.4–10.8)

## 2023-11-11 LAB — MATERNIT 21 PLUS CORE, BLOOD
Fetal Fraction: 17
Result (T21): NEGATIVE
Trisomy 13 (Patau syndrome): NEGATIVE
Trisomy 18 (Edwards syndrome): NEGATIVE
Trisomy 21 (Down syndrome): NEGATIVE

## 2023-11-11 LAB — HIV-1/HIV-2 QUALITATIVE RNA
HIV-1 RNA, Qualitative: NONREACTIVE
HIV-2 RNA, Qualitative: NONREACTIVE

## 2023-11-11 LAB — HCV INTERPRETATION

## 2023-11-11 LAB — HIV 1/2 AB DIFFERENTIATION
HIV 1 Ab: NONREACTIVE
HIV 2 Ab: NONREACTIVE
NOTE (HIV CONF MULTIP: NEGATIVE

## 2023-11-18 ENCOUNTER — Ambulatory Visit (INDEPENDENT_AMBULATORY_CARE_PROVIDER_SITE_OTHER): Admitting: Certified Nurse Midwife

## 2023-11-18 ENCOUNTER — Telehealth: Payer: Self-pay

## 2023-11-18 VITALS — BP 118/79 | HR 85 | Wt 119.0 lb

## 2023-11-18 DIAGNOSIS — R21 Rash and other nonspecific skin eruption: Secondary | ICD-10-CM | POA: Insufficient documentation

## 2023-11-18 DIAGNOSIS — Z3480 Encounter for supervision of other normal pregnancy, unspecified trimester: Secondary | ICD-10-CM

## 2023-11-18 MED ORDER — HYDROCORTISONE 1 % EX OINT: 1.0000 | TOPICAL_OINTMENT | Freq: Two times a day (BID) | CUTANEOUS | 0 refills | Status: DC

## 2023-11-18 NOTE — Progress Notes (Signed)
    Return Prenatal Note   Assessment/Plan   Plan  28 y.o. G2P0010 at [redacted]w[redacted]d presents for follow-up OB visit. Reviewed prenatal record including previous visit note.  Skin rash Here for itching that is worse at night on her chest and stomach. She has pictures of small raised red spots mainly on her chest. Today, there is not a lot of visible areas of irritation but it seems to come and go. Denies itching on hands and feet. Possibly PUPPS versus just skin sensitivities. Hydrocortisone cream sent to Rx. Reviewed safe skin care.    No orders of the defined types were placed in this encounter.  No follow-ups on file.   Future Appointments  Date Time Provider Department Center  12/03/2023  3:55 PM Gavino Fouch, Sherline Distel, CNM [facility] None    For next visit:  continue with routine prenatal care     Subjective   27 y.o. G2P0010 at [redacted]w[redacted]d presents for this follow-up prenatal visit.  Patient reports: see note above.     Objective   Flow sheet Vitals: Pulse Rate: 85 BP: 118/79 Total weight gain: -3 lb (-1.361 kg)  General Appearance  No acute distress, well appearing, and well nourished Pulmonary   Normal work of breathing Neurologic   Alert and oriented to person, place, and time Psychiatric   Mood and affect within normal limits  Donato Fu, CNM  11/17/2509:20 AM

## 2023-11-18 NOTE — Assessment & Plan Note (Signed)
 Here for itching that is worse at night on her chest and stomach. She has pictures of small raised red spots mainly on her chest. Today, there is not a lot of visible areas of irritation but it seems to come and go. Denies itching on hands and feet. Possibly PUPPS versus just skin sensitivities. Hydrocortisone cream sent to Rx. Reviewed safe skin care.

## 2023-11-18 NOTE — Telephone Encounter (Signed)
 Jacqueline Villarreal called triage stating she has developed the Pupps rash, I did advise her she would need to be seen so we can treat correctly. Patient understood and transferred.

## 2023-11-23 ENCOUNTER — Telehealth: Payer: Self-pay

## 2023-11-23 NOTE — Telephone Encounter (Signed)
 TRIAGE VOICEMAIL: Patient states she is 15 wks. She got the same breakfast sandwich from the same place she gets it from every morning. She opened the sandwich and there is raw egg yolk in it. She's already consumed some of it. Inquiring what she should do.

## 2023-11-23 NOTE — Telephone Encounter (Signed)
 Spoke to patient. Advised nothing to do but monitor for symptoms of food poisoning. If she begins to have nausea/vomiting, stay hydrated with pedialyte/gatorade (can freeze). Advised to ask for scrambled egg instead of fried and to check food prior to eating in the future.

## 2023-12-03 ENCOUNTER — Encounter: Admitting: Certified Nurse Midwife

## 2023-12-13 ENCOUNTER — Encounter: Payer: Self-pay | Admitting: Certified Nurse Midwife

## 2023-12-13 ENCOUNTER — Ambulatory Visit (INDEPENDENT_AMBULATORY_CARE_PROVIDER_SITE_OTHER): Admitting: Certified Nurse Midwife

## 2023-12-13 VITALS — BP 115/65 | Wt 124.0 lb

## 2023-12-13 DIAGNOSIS — Z362 Encounter for other antenatal screening follow-up: Secondary | ICD-10-CM | POA: Diagnosis not present

## 2023-12-13 DIAGNOSIS — Z3480 Encounter for supervision of other normal pregnancy, unspecified trimester: Secondary | ICD-10-CM | POA: Diagnosis not present

## 2023-12-14 ENCOUNTER — Telehealth: Payer: Self-pay

## 2023-12-14 NOTE — Telephone Encounter (Signed)
 Patient called, she is [redacted]w[redacted]d and has been experiencing some cramping x 2 hours. She feels cramping in her back as well. She notes that cramps are similar to menstrual cramps, but not as painful but have been coming more frequently. I sent patient a mychart message and advised patient that: Cramping can be normal during pregnancy as your uterus grows and stretches. It can also be caused by dehydration, urinary tract infections or more serious complications. Stay well hydrated by drinking a big glass of water and lay on your side. IF the cramps do not improve, or the cramping is severe, your are having any bleeding with the cramping or leakage of watery fluid you should be seen by a provider. I also advised her if cramping did not resolve in 1-2 days please contact office and schedule an appointment for further evaluation. Please advise.

## 2023-12-14 NOTE — Progress Notes (Signed)
    Return Prenatal Note   Assessment/Plan   Plan  28 y.o. G2P0010 at [redacted]w[redacted]d presents for follow-up OB visit. Reviewed prenatal record including previous visit note.  Supervision of other normal pregnancy, antepartum Still needs to schedule anatomy scan. Orders placed. Darcee feeling really anxious that her baby is doing well. Offered support.  Reviewed red flag warning signs anticipatory guidance for upcoming prenatal care.     Orders Placed This Encounter  Procedures   US  OB Comp + 14 Wk    Standing Status:   Future    Expected Date:   12/27/2023    Expiration Date:   03/14/2024    Reason for Exam (SYMPTOM  OR DIAGNOSIS REQUIRED):   anatomy    Preferred Imaging Location?:   [facility]   No follow-ups on file.   Future Appointments  Date Time Provider Department Center  01/10/2024  3:55 PM Forestine Igo, CNM [facility] None    For next visit:  continue with routine prenatal care     Subjective   27 y.o. G2P0010 at [redacted]w[redacted]d presents for this follow-up prenatal visit.  Patient reports frequent anxiety in general about health and is worried about baby even though she feels movement and overall feels well.  Patient reports: Movement: Present Contractions: Not present  Objective   Flow sheet Vitals: BP: 115/65 Fetal Heart Rate (bpm): 150 Total weight gain: 2 lb (0.907 kg)  General Appearance  No acute distress, well appearing, and well nourished Pulmonary   Normal work of breathing Neurologic   Alert and oriented to person, place, and time Psychiatric   Mood and affect within normal limits  Donato Fu, CNM  06/03/259:28 AM

## 2023-12-14 NOTE — Assessment & Plan Note (Signed)
 Still needs to schedule anatomy scan. Orders placed. Jacqueline Villarreal feeling really anxious that her baby is doing well. Offered support.  Reviewed red flag warning signs anticipatory guidance for upcoming prenatal care.

## 2023-12-27 ENCOUNTER — Ambulatory Visit

## 2023-12-27 DIAGNOSIS — Z362 Encounter for other antenatal screening follow-up: Secondary | ICD-10-CM | POA: Diagnosis not present

## 2023-12-27 DIAGNOSIS — Z3A20 20 weeks gestation of pregnancy: Secondary | ICD-10-CM

## 2023-12-29 ENCOUNTER — Encounter: Payer: Self-pay | Admitting: Certified Nurse Midwife

## 2024-01-01 ENCOUNTER — Encounter: Payer: Self-pay | Admitting: Certified Nurse Midwife

## 2024-01-05 ENCOUNTER — Encounter: Payer: Self-pay | Admitting: Certified Nurse Midwife

## 2024-01-10 ENCOUNTER — Ambulatory Visit (INDEPENDENT_AMBULATORY_CARE_PROVIDER_SITE_OTHER): Admitting: Certified Nurse Midwife

## 2024-01-10 ENCOUNTER — Encounter: Payer: Self-pay | Admitting: Certified Nurse Midwife

## 2024-01-10 VITALS — BP 105/68 | HR 67 | Wt 130.6 lb

## 2024-01-10 DIAGNOSIS — Z3480 Encounter for supervision of other normal pregnancy, unspecified trimester: Secondary | ICD-10-CM

## 2024-01-10 DIAGNOSIS — Z3A22 22 weeks gestation of pregnancy: Secondary | ICD-10-CM

## 2024-01-10 DIAGNOSIS — Z369 Encounter for antenatal screening, unspecified: Secondary | ICD-10-CM | POA: Diagnosis not present

## 2024-01-10 DIAGNOSIS — Z114 Encounter for screening for human immunodeficiency virus [HIV]: Secondary | ICD-10-CM

## 2024-01-10 DIAGNOSIS — Z131 Encounter for screening for diabetes mellitus: Secondary | ICD-10-CM | POA: Diagnosis not present

## 2024-01-10 DIAGNOSIS — Z113 Encounter for screening for infections with a predominantly sexual mode of transmission: Secondary | ICD-10-CM

## 2024-01-10 NOTE — Progress Notes (Signed)
    Return Prenatal Note   Subjective   28 y.o. G2P0010 at [redacted]w[redacted]d presents for this follow-up prenatal visit.  Patient reports anxiety about her wellbeing with birth, fear of hemorrhage due to her medical history. She is concerned about the fluid around her ovary that was seen at her initial ultrasound and that her ovaries were not seen at her anatomy ultrasound. Patient reports: Movement: Present Contractions: Not present  Objective   Flow sheet Vitals: Pulse Rate: 67 BP: 105/68 Fundal Height: 22 cm Fetal Heart Rate (bpm): 150 Total weight gain: 8 lb 9.6 oz (3.901 kg)  General Appearance  No acute distress, well appearing, and well nourished Pulmonary   Normal work of breathing Neurologic   Alert and oriented to person, place, and time Psychiatric   Mood and affect within normal limits  Assessment/Plan   Plan  28 y.o. G2P0010 at [redacted]w[redacted]d presents for follow-up OB visit. Reviewed prenatal record including previous visit note.  Supervision of other normal pregnancy, antepartum Red flag symptoms reviewed. Anticipatory guidance for 28w labs discussed. Encouraged childbirth education to increase knowledge about process of birth. She desires vaginal birth but is fearful of complications.      Orders Placed This Encounter  Procedures   28 Week RH+Panel    Standing Status:   Future    Expected Date:   02/09/2024    Expiration Date:   01/09/2025   Return in 4 weeks (on 02/07/2024) for ROB & GDM screening.   Future Appointments  Date Time Provider Department Center  02/07/2024 10:00 AM [facility] None  02/07/2024 10:55 AM Dominic, Jinnie Jansky, CNM [facility] None    For next visit:  ROB with 1 hour glucola, third trimester labs, and Tdap     Harlene LITTIE Cisco, CNM  06/30/255:19 PM

## 2024-01-10 NOTE — Assessment & Plan Note (Signed)
 Red flag symptoms reviewed. Anticipatory guidance for 28w labs discussed. Encouraged childbirth education to increase knowledge about process of birth. She desires vaginal birth but is fearful of complications.

## 2024-01-10 NOTE — Patient Instructions (Signed)
 Gestational Diabetes Mellitus, Diagnosis Gestational diabetes mellitus, or gestational diabetes, is diabetes that some people get when pregnant. This condition usually happens at 24-28 weeks of pregnancy.  People with gestational diabetes have high blood sugar. If you do not get treated for this condition, it may cause problems for you and your baby. It goes away after you give birth but can happen again the next time you become pregnant. What are the causes? This condition is caused by changes in your body when you're pregnant. When these changes happen: The pancreas may not make enough insulin. The body may not use insulin in the right way. This is called insulin resistance. Insulin helps your body use sugar for energy. If your body doesn't have enough insulin or can't use the insulin that it has, extra sugars stay in your blood. This leads to high blood sugar (hyperglycemia). What increases the risk? Being older than age 93 when pregnant. Too much body weight. Having polycystic ovary syndrome (PCOS). Having someone with diabetes in your family. Having had this condition in the past. Being pregnant with more than one baby. What are the signs or symptoms? You may not have any symptoms. If you do have symptoms, they may include: Being thirsty often. Being hungry often. Needing to pee more often. These symptoms can be missed because they're similar to other symptoms of pregnancy. How is this diagnosed? This condition may be diagnosed based on your blood sugar level and how your body responds to glucose. This may be checked with an oral glucose tolerance test (OGTT). The test may be done: Early in pregnancy if you have risk factors. At 24-28 weeks of your pregnancy. How is this treated? To treat this condition, you may be told to: Eat a healthy diet. Get more exercise. Check your blood sugar often. Your health care provider will tell you what your target is. Take insulin and other  medicines. These are taken if needed. Work with a diabetes expert. Follow these instructions at home: Learn about your diabetes Ask your provider: How often should I check my blood sugar? Where do I get the equipment? What medicines do I need? When should I take them? Do I need to meet with an educator? Who can I call if I have questions? Where can I find a support group? General instructions Take medicines only as told by your provider. Stay at a healthy weight. Drink enough fluid to keep your pee (urine) pale yellow. Check your pee for ketones when sick and as told. Ketones in your pee is a sign that your body is using fat for energy because it's not making enough insulin. Wear an alert bracelet or carry a card that shows you have this condition. Keep all follow-up visits. Your provider needs to check your health and your baby's growth. Where to find more information Gestational Diabetes: American Diabetes Association (ADA): diabetes.org Gestational Diabetes and Pregnancy: Centers for Disease Control and Prevention (CDC): TonerPromos.no American Pregnancy Association: americanpregnancy.org [place] MyPlate: WrestlingReporter.dk Contact a health care provider if:  Your blood sugar is above your target for two tests in a row. You have a high blood sugar level and you also have ketones in your pee. You have been sick or have had a fever for 2 days or more and aren't getting better. You have any of these problems for more than 6 hours: You can't eat or drink. You vomit or feel like you may vomit. You have watery poop (diarrhea). Get help right away if:  You become confused or cannot think clearly. You have trouble breathing. You have chest pain. Your baby is moving less than usual. You have unusual discharge or bleeding from your vagina. You have cramping in your belly or have pain in your hips or lower back. You have symptoms of high blood pressure or preeclampsia. These include: A severe,  throbbing headache that doesn't go away. Sudden or extreme swelling of your face, hands, legs, or feet. Vision problems, such as: Seeing spots. Blurry vision. Sensitivity to light. These symptoms may be an emergency. Get help right away. Call 911. Do not wait to see if the symptoms will go away. Do not drive yourself to the hospital. This information is not intended to replace advice given to you by your health care provider. Make sure you discuss any questions you have with your health care provider. Document Revised: 04/01/2023 Document Reviewed: 10/09/2022 Elsevier Patient Education  2024 [employer] Second Trimester of Pregnancy  The second trimester of pregnancy is from week 14 through week 27. This is months 4 through 6 of pregnancy. During the second trimester: Morning sickness is less or has stopped. You may have more energy. You may feel hungry more often. At this time, your unborn baby is growing very fast. At the end of the sixth month, the unborn baby may be up to 12 inches long and weigh about 1 pounds. You will likely start to feel the baby move between 16 and 20 weeks of pregnancy. Body changes during your second trimester Your body continues to change during this time. The changes usually go away after your baby is born. Physical changes You will gain more weight. Your belly will get bigger. You may begin to get stretch marks on your hips, belly, and breasts. Your breasts will keep growing and may hurt. You may get dark spots or blotches on your face. A dark line from your belly button to the pubic area may appear. This line is called linea nigra. Your hair may grow faster and get thicker. Health changes You may have headaches. You may have heartburn. You may pee more often. You may have swollen, bulging veins (varicose veins). You may have trouble pooping (constipation), or swollen veins in the butt that can itch or get painful (hemorrhoids). You may have back  pain. This is caused by: Weight gain. Pregnancy hormones that are relaxing the joints in your pelvis. Follow these instructions at home: Medicines Talk to your health care provider if you're taking medicines. Ask if the medicines are safe to take during pregnancy. Your provider may change the medicines that you take. Do not take any medicines unless told to by your provider. Take a prenatal vitamin that has at least 600 micrograms (mcg) of folic acid. Do not use herbal medicines, illegal drugs, or medicines that are not approved by your provider. Eating and drinking While you're pregnant your body needs extra food for your growing baby. Talk with your provider about what to eat while pregnant. Activity Most women are able to exercise during pregnancy. Exercises may need to change as your pregnancy goes on. Talk to your provider about your activities and exercise routines. Relieving pain and discomfort Wear a good, supportive bra if your breasts hurt. Rest with your legs raised if you have leg cramps or low back pain. Take warm sitz baths to soothe pain from hemorrhoids. Use hemorrhoid cream if your provider says it's okay. Do not douche. Do not use tampons or scented pads. Do not  use hot tubs, steam rooms, or saunas. Safety Wear your seatbelt at all times when you're in a car. Talk to your provider if someone hits you, hurts you, or yells at you. Talk with your provider if you're feeling sad or have thoughts of hurting yourself. Lifestyle Certain things can be harmful while you're pregnant. It's best to avoid the following: Do not drink alcohol,smoke, vape, or use products with nicotine or tobacco in them. If you need help quitting, talk with your provider. Avoid cat litter boxes and soil used by cats. These things carry germs that can cause harm to your pregnancy and your baby. General instructions Keep all follow-up visits. It helps you and your unborn baby stay as healthy as  possible. Write down your questions. Take them to your prenatal visits. Your provider will: Talk with you about your overall health. Give you advice or refer you to specialists who can help with different needs, including: Prenatal education classes. Mental health and counseling. Foods and healthy eating. Ask for help if you need help with food. Where to find more information American Pregnancy Association: americanpregnancy.org [employer]ians [employer]necologists: acog.org Office on [employer] Health: TravelLesson.ca Contact a health care provider if: You have a headache that does not go away when you take medicine. You have any of these problems: You can't eat or drink. You throw up or feel like you may throw up. You have watery poop (diarrhea) for 2 days or more. You have pain when you pee or your pee smells bad. You have been sick for 2 days or more and are not getting better. Contact your provider right away if: You have any of these coming from your vagina: Abnormal discharge. Bad-smelling fluid. Bleeding. Your baby is moving less than usual. You have contractions, belly cramping, or have pain in your pelvis or lower back. You have symptoms of high blood pressure or preeclampsia. These include: A severe, throbbing headache that does not go away. Sudden or extreme swelling of your face, hands, legs, or feet. Vision problems: You see spots. You have blurry vision. Your eyes are sensitive to light. If you can't reach the provider, go to an urgent care or emergency room. Get help right away if: You faint, become confused, or can't think clearly. You have chest pain or trouble breathing. You have any kind of injury, such as from a fall or a car crash. These symptoms may be an emergency. Call 911 right away. Do not wait to see if the symptoms will go away. Do not drive yourself to the hospital. This information is not intended to replace advice given to you by  your health care provider. Make sure you discuss any questions you have with your health care provider. Document Revised: 04/01/2023 Document Reviewed: 10/30/2022 Elsevier Patient Education  2024 [employer].

## 2024-02-04 ENCOUNTER — Encounter: Payer: Self-pay | Admitting: Certified Nurse Midwife

## 2024-02-04 ENCOUNTER — Other Ambulatory Visit: Payer: Self-pay

## 2024-02-04 ENCOUNTER — Observation Stay
Admission: EM | Admit: 2024-02-04 | Discharge: 2024-02-04 | Disposition: A | Discharge status: Home / Self Care | Attending: Certified Nurse Midwife | Admitting: Certified Nurse Midwife

## 2024-02-04 DIAGNOSIS — O368120 Decreased fetal movements, second trimester, not applicable or unspecified: Secondary | ICD-10-CM | POA: Diagnosis present

## 2024-02-04 DIAGNOSIS — Z3A26 26 weeks gestation of pregnancy: Secondary | ICD-10-CM | POA: Diagnosis not present

## 2024-02-04 NOTE — Discharge Summary (Signed)
 LABOR & DELIVERY OB TRIAGE NOTE  SUBJECTIVE  HPI Jacqueline Villarreal is a 28 y.o. G2P0010 at [redacted]w[redacted]d who presents to Labor & Delivery for decreased fetal movement & concern for change to movement. She noticed a period of about 70m where baby's movements felt different & more rhythmic, like a heartbeat but happening every ~2 seconds. Has felt usual movement since arrival to triage. Denies contractions, loss of fluid or vaginal bleeding.  OB History     Gravida  2   Para  0   Term  0   Preterm  0   AB  1   Living  0      SAB  1   IAB  0   Ectopic  0   Multiple  0   Live Births  0            OBJECTIVE  BP (!) 111/49   Pulse 69   Temp 98.7 F (37.1 C) (Oral)   Resp 16   Ht 5' 4 (1.626 m)   Wt 61.2 kg   LMP 05/20/2023   BMI 23.17 kg/m   General: A&Ox3, NAD Heart: regular rate Lungs: normal work of breathing Abdomen: soft, nontender, gravid Cervical exam:   deferred  NST I reviewed the NST and it was reactive.  Baseline: 135 Variability: moderate Accelerations: present, 10x10 Decelerations:none Toco: quiet, soft resting tone Category I  No results found for this or any previous visit (from the past 24 hours).  No results found.  ASSESSMENT Impression  1) Pregnancy at G2P0010, [redacted]w[redacted]d, Estimated Date of Delivery: 05/09/24 2) Reassuring maternal/fetal status  PLAN Reviewed likely hiccoughs given description of movements. Discharge home with preterm labor & fetal movement precautions. Maintain next visit as scheduled. Harlene LITTIE Cisco, CNM 02/04/24  10:35 PM

## 2024-02-04 NOTE — OB Triage Note (Signed)
 Pt arrives due some altered fetal movement that she felt around 2145.

## 2024-02-07 ENCOUNTER — Other Ambulatory Visit

## 2024-02-07 ENCOUNTER — Encounter: Payer: Self-pay | Admitting: Licensed Practical Nurse

## 2024-02-07 ENCOUNTER — Ambulatory Visit (INDEPENDENT_AMBULATORY_CARE_PROVIDER_SITE_OTHER): Admitting: Licensed Practical Nurse

## 2024-02-07 VITALS — BP 105/68 | HR 70 | Wt 135.3 lb

## 2024-02-07 DIAGNOSIS — Z3480 Encounter for supervision of other normal pregnancy, unspecified trimester: Secondary | ICD-10-CM | POA: Diagnosis not present

## 2024-02-07 DIAGNOSIS — Z3A26 26 weeks gestation of pregnancy: Secondary | ICD-10-CM | POA: Diagnosis not present

## 2024-02-07 DIAGNOSIS — Z114 Encounter for screening for human immunodeficiency virus [HIV]: Secondary | ICD-10-CM

## 2024-02-07 DIAGNOSIS — Z131 Encounter for screening for diabetes mellitus: Secondary | ICD-10-CM

## 2024-02-07 DIAGNOSIS — Z369 Encounter for antenatal screening, unspecified: Secondary | ICD-10-CM

## 2024-02-07 DIAGNOSIS — Z113 Encounter for screening for infections with a predominantly sexual mode of transmission: Secondary | ICD-10-CM

## 2024-02-07 NOTE — Assessment & Plan Note (Addendum)
-  rec CBE and BF links sent -her partner and aunt will be her labor support, he has a 26 y/o that will be with his mother during labor.  -28wk labs collected -TDAP next visit -warning signs reviewed

## 2024-02-07 NOTE — Progress Notes (Signed)
    Return Prenatal Note   Subjective   28 y.o. G2P0010 at [redacted]w[redacted]d presents for this follow-up prenatal visit.  Patient Here with her father  Patient reports:Doing well, reports her anxiety has not been bad recently. Desires to breastfeed but worries about it being time consuming/complicated, plans to return to work  at [employer].  Movement: Present Contractions: Not present  Objective   Flow sheet Vitals: Pulse Rate: 70 BP: 105/68 Fundal Height: 25 cm Fetal Heart Rate (bpm): 150 Total weight gain: 13 lb 4.8 oz (6.033 kg)  General Appearance  No acute distress, well appearing, and well nourished Pulmonary   Normal work of breathing Neurologic   Alert and oriented to person, place, and time Psychiatric   Mood and affect within normal limits  Assessment/Plan   Plan  28 y.o. G2P0010 at [redacted]w[redacted]d presents for follow-up OB visit. Reviewed prenatal record including previous visit note.  Supervision of other normal pregnancy, antepartum -rec CBE and BF links sent -her partner and aunt will be her labor support, he has a 26 y/o that will be with his mother during labor.  -28wk labs collected -TDAP next visit -warning signs reviewed      No orders of the defined types were placed in this encounter.  Return in about 2 weeks (around 02/21/2024) for ROB.   Future Appointments  Date Time Provider Department Center  02/22/2024  2:55 PM Starla Harland BROCKS, MD [facility] None    For next visit:  continue with routine prenatal care     JINNIE HERO [facility], CNM  02/06/2510:26 AM

## 2024-02-10 ENCOUNTER — Ambulatory Visit: Payer: Self-pay | Admitting: Certified Nurse Midwife

## 2024-02-10 ENCOUNTER — Encounter: Payer: Self-pay | Admitting: Certified Nurse Midwife

## 2024-02-10 DIAGNOSIS — Z789 Other specified health status: Secondary | ICD-10-CM | POA: Insufficient documentation

## 2024-02-10 LAB — 28 WEEK RH+PANEL
Basophils Absolute: 0 x10E3/uL (ref 0.0–0.2)
Basos: 0 %
EOS (ABSOLUTE): 0.1 x10E3/uL (ref 0.0–0.4)
Eos: 1 %
Gestational Diabetes Screen: 121 mg/dL (ref 70–139)
HIV Screen 4th Generation wRfx: REACTIVE
Hematocrit: 34.6 % (ref 34.0–46.6)
Hemoglobin: 11.1 g/dL (ref 11.1–15.9)
Immature Grans (Abs): 0.1 x10E3/uL (ref 0.0–0.1)
Immature Granulocytes: 1 %
Lymphocytes Absolute: 1.1 x10E3/uL (ref 0.7–3.1)
Lymphs: 12 %
MCH: 30.5 pg (ref 26.6–33.0)
MCHC: 32.1 g/dL (ref 31.5–35.7)
MCV: 95 fL (ref 79–97)
Monocytes Absolute: 0.4 x10E3/uL (ref 0.1–0.9)
Monocytes: 5 %
Neutrophils Absolute: 7.5 x10E3/uL — ABNORMAL HIGH (ref 1.4–7.0)
Neutrophils: 81 %
Platelets: 149 x10E3/uL — ABNORMAL LOW (ref 150–450)
RBC: 3.64 x10E6/uL — ABNORMAL LOW (ref 3.77–5.28)
RDW: 11.7 % (ref 11.7–15.4)
RPR Ser Ql: NONREACTIVE
WBC: 9.2 x10E3/uL (ref 3.4–10.8)

## 2024-02-10 LAB — HIV 1/2 AB DIFFERENTIATION
HIV 1 Ab: NONREACTIVE
HIV 2 Ab: NONREACTIVE
NOTE (HIV CONF MULTIP: NEGATIVE

## 2024-02-10 LAB — HIV-1/HIV-2 QUALITATIVE RNA
Final Interpretation: NEGATIVE
HIV-1 RNA, Qualitative: NONREACTIVE
HIV-2 RNA, Qualitative: NONREACTIVE

## 2024-02-10 LAB — SPECIMEN STATUS REPORT

## 2024-02-22 ENCOUNTER — Ambulatory Visit: Admitting: Obstetrics & Gynecology

## 2024-02-22 VITALS — BP 108/64 | HR 91 | Wt 139.0 lb

## 2024-02-22 DIAGNOSIS — Z3483 Encounter for supervision of other normal pregnancy, third trimester: Secondary | ICD-10-CM

## 2024-02-22 DIAGNOSIS — Z3480 Encounter for supervision of other normal pregnancy, unspecified trimester: Secondary | ICD-10-CM

## 2024-02-22 DIAGNOSIS — Z3A29 29 weeks gestation of pregnancy: Secondary | ICD-10-CM | POA: Diagnosis not present

## 2024-02-22 DIAGNOSIS — Z23 Encounter for immunization: Secondary | ICD-10-CM | POA: Diagnosis not present

## 2024-02-22 NOTE — Progress Notes (Signed)
   PRENATAL VISIT NOTE  Subjective:  Jacqueline Villarreal is a 28 y.o. G2P0010 at 104w0d being seen today for ongoing prenatal care.  She is currently monitored for the following issues for this low-risk pregnancy and has Anxiety about health; Ovarian cyst; Supervision of other normal pregnancy, antepartum; Skin rash; and False positive HIV serology on their problem list.  Patient reports no complaints.  Contractions: Irritability. Vag. Bleeding: None.  Movement: Present. Denies leaking of fluid.   The following portions of the patient's history were reviewed and updated as appropriate: allergies, current medications, past family history, past medical history, past social history, past surgical history and problem list.   Objective:    Vitals:   02/22/24 1447  BP: 108/64  Pulse: 91  Weight: 139 lb (63 kg)    Fetal Status:  Fetal Heart Rate (bpm): 135 Fundal Height: 27 cm Movement: Present    General: Alert, oriented and cooperative. Patient is in no acute distress.  Skin: Skin is warm and dry. No rash noted.   Cardiovascular: Normal heart rate noted  Respiratory: Normal respiratory effort, no problems with respiration noted  Abdomen: Soft, gravid, appropriate for gestational age.  Pain/Pressure: Absent     Pelvic: Cervical exam deferred        Extremities: Normal range of motion.     Mental Status: Normal mood and affect. Normal behavior. Normal judgment and thought content.   Assessment and Plan:  Pregnancy: G2P0010 at [redacted]w[redacted]d 1. Supervision of other normal pregnancy, antepartum (Primary)  - Tdap vaccine greater than or equal to 7yo IM  2. [redacted] weeks gestation of pregnancy  - Tdap vaccine greater than or equal to 7yo IM  3. Need for Tdap vaccination  - Tdap vaccine greater than or equal to 7yo IM  Preterm labor symptoms and general obstetric precautions including but not limited to vaginal bleeding, contractions, leaking of fluid and fetal movement were reviewed in detail with the  patient. Please refer to After Visit Summary for other counseling recommendations.   Come back in 2 weeks  Harland JAYSON Birkenhead, MD

## 2024-03-02 ENCOUNTER — Encounter: Payer: Self-pay | Admitting: Obstetrics and Gynecology

## 2024-03-02 ENCOUNTER — Telehealth: Payer: Self-pay

## 2024-03-02 ENCOUNTER — Other Ambulatory Visit: Payer: Self-pay

## 2024-03-02 ENCOUNTER — Observation Stay
Admission: EM | Admit: 2024-03-02 | Discharge: 2024-03-02 | Disposition: A | Discharge status: Home / Self Care | Attending: Obstetrics and Gynecology | Admitting: Obstetrics and Gynecology

## 2024-03-02 DIAGNOSIS — R109 Unspecified abdominal pain: Secondary | ICD-10-CM | POA: Diagnosis not present

## 2024-03-02 DIAGNOSIS — R319 Hematuria, unspecified: Secondary | ICD-10-CM | POA: Diagnosis not present

## 2024-03-02 DIAGNOSIS — O26893 Other specified pregnancy related conditions, third trimester: Secondary | ICD-10-CM | POA: Diagnosis present

## 2024-03-02 DIAGNOSIS — R399 Unspecified symptoms and signs involving the genitourinary system: Secondary | ICD-10-CM

## 2024-03-02 DIAGNOSIS — R1084 Generalized abdominal pain: Secondary | ICD-10-CM | POA: Diagnosis not present

## 2024-03-02 DIAGNOSIS — Z87891 Personal history of nicotine dependence: Secondary | ICD-10-CM | POA: Insufficient documentation

## 2024-03-02 DIAGNOSIS — O99891 Other specified diseases and conditions complicating pregnancy: Secondary | ICD-10-CM

## 2024-03-02 DIAGNOSIS — Z3A30 30 weeks gestation of pregnancy: Secondary | ICD-10-CM | POA: Diagnosis not present

## 2024-03-02 DIAGNOSIS — Z789 Other specified health status: Principal | ICD-10-CM

## 2024-03-02 LAB — URINALYSIS, COMPLETE (UACMP) WITH MICROSCOPIC
Bilirubin Urine: NEGATIVE
Glucose, UA: NEGATIVE mg/dL
Ketones, ur: 5 mg/dL — AB
Leukocytes,Ua: NEGATIVE
Nitrite: NEGATIVE
Protein, ur: NEGATIVE mg/dL
RBC / HPF: >50 RBC/hpf (ref 0–5)
Specific Gravity, Urine: 1.002 — ABNORMAL LOW (ref 1.005–1.030)
pH: 7 (ref 5.0–8.0)

## 2024-03-02 LAB — CHLAMYDIA/NGC RT PCR (ARMC ONLY)
Chlamydia Tr: NOT DETECTED
N gonorrhoeae: NOT DETECTED

## 2024-03-02 LAB — WET PREP, GENITAL
Clue Cells Wet Prep HPF POC: NONE SEEN
Sperm: NONE SEEN
Trich, Wet Prep: NONE SEEN
WBC, Wet Prep HPF POC: >=10 — AB (ref <10)
Yeast Wet Prep HPF POC: NONE SEEN

## 2024-03-02 MED ORDER — SULFAMETHOXAZOLE-TRIMETHOPRIM 200-40 MG/5ML PO SUSP: 20.0000 mL | Freq: Two times a day (BID) | ORAL | 0 refills | Status: DC

## 2024-03-02 MED ORDER — SULFAMETHOXAZOLE-TRIMETHOPRIM 200-40 MG/5ML PO SUSP
20.0000 mL | Freq: Two times a day (BID) | ORAL | Status: DC
  Filled 2024-03-02: qty 20

## 2024-03-02 MED ORDER — SULFAMETHOXAZOLE-TRIMETHOPRIM 800-160 MG PO TABS
1.0000 | ORAL_TABLET | Freq: Two times a day (BID) | ORAL | Status: DC
  Filled 2024-03-02: qty 1

## 2024-03-02 NOTE — OB Triage Note (Signed)
 Pt is a 27yo G2P0010, 30w 2d. She arrived to the unit by EMS with complaints of pinkish urine/discharge when she wipes. She states she had an orgasm at 0730 and experienced cramping and back pain intermittently until 1500. At 1000 she states she lost her mucus plug. She denies vaginal bleeding, reports positive fetal movement. VS stable, monitors applied and assessing. She denies burning with urination, and vaginal itchiness/discomfort.  Initial FHT 135 at 1632.

## 2024-03-02 NOTE — Progress Notes (Signed)
Discharge instructions provided to pt. Pt verbalizes understanding. Vaginal bleeding and discharge, contractions, and fetal movement reviewed by RN. Follow-up care reviewed. Pt discharged home with significant other.  

## 2024-03-02 NOTE — Discharge Summary (Signed)
 Physician Final Progress Note  Patient ID: MCCARTNEY BRUCKS MRN: [id number] DOB/AGE: 10/02/1995 28 y.o.  Admit date: 03/02/2024 Admitting provider: Bebe Furry, MD Discharge date: 03/02/2024   Admission Diagnoses:  1) intrauterine pregnancy at [redacted]w[redacted]d  2) cramping 3) blood in urine   Discharge Diagnoses:  Principal Problem:   Indication for care in labor and delivery, antepartum  Possible UTI   History of Present Illness: The patient is a 28 y.o. female G2P0010 at [redacted]w[redacted]d who presents for cramping, back pain and blood in her urine. This morning Alicia had an orgasm using a vibrator, there was no penetration. Soon after she had low back pain and cramps. As the day progressed, the discomfort went away. Around 3pm she low back pain and cramping again, she went to use the bathroom and noticed blood in her urine stream. She arrived by EMS, reporting that she felt dizzy and light headed when she saw the blood in her urine and felt like she was not safe to drive. She has a hx of medical anxiety. She endorses +FM, denies LOF/VB, she did have some mucus like discharge this morning. She does not think she is contracting. Denies dysuria, urgency or frequency, denies fevers or flank pain.    Past Medical History:  Diagnosis Date   Ovarian cyst     Past Surgical History:  Procedure Laterality Date   MOUTH SURGERY     when pt had braces    No current facility-administered medications on file prior to encounter.   Current Outpatient Medications on File Prior to Encounter  Medication Sig Dispense Refill   Prenatal Vit-Fe Fumarate-FA (PRENATAL MULTIVITAMIN) TABS tablet Take 1 tablet by mouth daily at 12 noon.      No Known Allergies  Social History   Socioeconomic History   Marital status: Significant Other    Spouse name: issac   Number of children: 0   Years of education: Not on file   Highest education level: GED or equivalent  Occupational History   Not on file  Tobacco Use    Smoking status: Former    Types: E-cigarettes   Smokeless tobacco: Never   Tobacco comments:    I vape every day but am decreasing the use  Vaping Use   Vaping status: Every Day   Substances: Nicotine , Flavoring  Substance and Sexual Activity   Alcohol use: Not Currently    Alcohol/week: 1.0 standard drink of alcohol    Types: 1 Glasses of wine per week    Comment: occasionally   Drug use: No   Sexual activity: Yes    Birth control/protection: Pill  Other Topics Concern   Not on file  Social History Narrative   Not on file   Social Drivers of Health   Financial Resource Strain: Low Risk  (10/01/2023)   Overall Financial Resource Strain (CARDIA)    Difficulty of Paying Living Expenses: Not very hard  Food Insecurity: No Food Insecurity (10/01/2023)   Hunger Vital Sign    Worried About Running Out of Food in the Last Year: Never true    Ran Out of Food in the Last Year: Never true  Transportation Needs: No Transportation Needs (10/01/2023)   PRAPARE - Administrator, Civil Service (Medical): No    Lack of Transportation (Non-Medical): No  Physical Activity: Sufficiently Active (10/01/2023)   Exercise Vital Sign    Days of Exercise per Week: 5 days    Minutes of Exercise per Session: 40 min  Stress: No Stress Concern Present (10/01/2023)   [employer] of Occupational Health - Occupational Stress Questionnaire    Feeling of Stress : Not at all  Social Connections: Moderately Integrated (10/01/2023)   Social Connection and Isolation Panel    Frequency of Communication with Friends and Family: More than three times a week    Frequency of Social Gatherings with Friends and Family: More than three times a week    Attends Religious Services: 1 to 4 times per year    Active Member of [employer] or Organizations: No    Attends Banker Meetings: Never    Marital Status: Living with partner  Intimate Partner Violence: Not At Risk (06/24/2023)   Humiliation,  Afraid, Rape, and Kick questionnaire    Fear of Current or Ex-Partner: No    Emotionally Abused: No    Physically Abused: No    Sexually Abused: No    Family History  Problem Relation Age of Onset   Heart block Mother    Diabetes Mother    Diabetes Brother    Cancer Paternal Grandmother    Irritable bowel syndrome Paternal Grandmother    Breast cancer Paternal Aunt      ROS see HPI   Physical Exam: BP 114/69 (BP Location: Right Arm)   Pulse 98   Temp 98.2 F (36.8 C) (Oral)   Resp 18   Ht 5' 4 (1.626 m)   Wt 63.5 kg   LMP 05/20/2023   BMI 24.03 kg/m   Physical Exam Constitutional:      Appearance: Normal appearance.  Genitourinary:     Vulva normal.     Genitourinary Comments: Urethra irritated but skin intact   SSE: cervix pink, no lesions, visually closed, mucus like discharge present, no bleeding.   Cardiovascular:     Rate and Rhythm: Normal rate.  Pulmonary:     Breath sounds: Normal breath sounds.  Abdominal:     Tenderness: There is no abdominal tenderness.     Comments: gravid  Musculoskeletal:     Right lower leg: No edema.     Left lower leg: No edema.  Neurological:     General: No focal deficit present.     Mental Status: She is alert.  Skin:    General: Skin is warm.  Psychiatric:        Mood and Affect: Mood normal.        Thought Content: Thought content normal.    EFM: baseline 140, moderate variability, pos accel, neg decel TOCO: rare contraction   Consults: None  Significant Findings/ Diagnostic Studies: bacteria and RBC present on U dip, wet prep negative, gc/ct and urine culture pending.    Procedures: RNST   Hospital Course: The patient was admitted to Labor and Delivery Triage for observation. She sat comfortably in bed. Her pelvic exam was normal except for some urethral irritation which she reports she was aggressive with wiping today. Given UA and blood in urine will treat for possible UTI. Pt unable to swallow large pills,  requesting liquid form. Gc/ct and urine culture pending, will call pt with abnormal results. Consider Kidney US  out pt if urine culture negative.   Dr Izell called, reviewed assessment and plan, in agreement.   Discharge Condition: stable  Disposition: Discharge disposition: 01-Home or Self Care       Diet: Regular diet  Discharge Activity: Activity as tolerated   Allergies as of 03/02/2024   No Known Allergies  Medication List     TAKE these medications    prenatal multivitamin Tabs tablet Take 1 tablet by mouth daily at 12 noon.   sulfamethoxazole -trimethoprim  200-40 MG/5ML suspension Commonly known as: BACTRIM  Take 20 mLs by mouth every 12 (twelve) hours.         Total time spent taking care of this patient: 30 minutes  Signed: JINNIE HERO [facility], CNM  03/02/2024, 6:09 PM

## 2024-03-02 NOTE — Telephone Encounter (Signed)
 Jacqueline Villarreal called triage stating around 7:30 AM she had an organism and since then she's had back and hip pain she did go to the bathroom and had a yellowish mucous discharge. I did advise her it could of been her mucous plug and that's normal. Her back is feeling better.

## 2024-03-03 LAB — URINE CULTURE: Culture: NO GROWTH

## 2024-03-07 ENCOUNTER — Ambulatory Visit (INDEPENDENT_AMBULATORY_CARE_PROVIDER_SITE_OTHER): Admitting: Obstetrics

## 2024-03-07 VITALS — BP 112/71 | HR 88 | Wt 141.0 lb

## 2024-03-07 DIAGNOSIS — R4589 Other symptoms and signs involving emotional state: Secondary | ICD-10-CM

## 2024-03-07 DIAGNOSIS — Z3480 Encounter for supervision of other normal pregnancy, unspecified trimester: Secondary | ICD-10-CM | POA: Diagnosis not present

## 2024-03-07 NOTE — Progress Notes (Unsigned)
    Return Prenatal Note   Subjective  28 y.o. G2P0010 at [redacted]w[redacted]d presents for this follow-up prenatal visit.   Patient was given antibiotics from L&D for an abnormal urinalysis, but patient did not take them.  Urine culture can back clean.   Patient reports: Movement: Present Contractions: Not present Denies vaginal bleeding or leaking fluid. Objective  Flow sheet Vitals: Pulse Rate: 88 BP: 112/71 Total weight gain: 19 lb (8.618 kg)  General Appearance  No acute distress, well appearing, and well nourished Pulmonary   Normal work of breathing Neurologic   Alert and oriented to person, place, and time Psychiatric   Mood and affect within normal limits   Assessment/Plan   Plan  28 y.o. G2P0010 at [redacted]w[redacted]d presents for follow-up OB visit. Reviewed prenatal record including previous visit note. 1. Supervision of other normal pregnancy, antepartum (Primary) -Medicaid Risk Screen completed today and sent to [facility] -   No problem-specific Assessment & Plan notes found for this encounter.    No orders of the defined types were placed in this encounter.  Return in about 2 weeks (around 03/21/2024) for ROB.   No future appointments.   For next visit:  {[email or web address]:[id number]}      Estil Mangle, DO [facility]

## 2024-03-07 NOTE — Patient Instructions (Signed)
 Third Trimester of Pregnancy  The third trimester of pregnancy is from week 28 through week 40. This is months 7 through 9. The third trimester is a time when your baby is growing fast. Body changes during your third trimester Your body continues to change during this time. The changes usually go away after your baby is born. Physical changes You will continue to gain weight. You may get stretch marks on your hips, belly, and breasts. Your breasts will keep growing and may hurt. A yellow fluid (colostrum) may leak from your breasts. This is the first milk you're making for your baby. Your hair may grow faster and get thicker. In some cases, you may get hair loss. Your belly button may stick out. You may have more swelling in your hands, face, or ankles. Health changes You may have heartburn. You may feel short of breath. This is caused by the uterus that is now bigger. You may have more aches in the pelvis, back, or thighs. You may have more tingling or numbness in your hands, arms, and legs. You may pee more often. You may have trouble pooping (constipation) or swollen veins in the butt that can itch or get painful (hemorrhoids). Other changes You may have more problems sleeping. You may notice the baby moving lower in your belly (dropping). You may have more fluid coming from your vagina. Your joints may feel loose, and you may have pain around your pelvic bone. Follow these instructions at home: Medicines Take medicines only as told by your health care provider. Some medicines are not safe during pregnancy. Your provider may change the medicines that you take. Do not take any medicines unless told to by your provider. Take a prenatal vitamin that has at least 600 micrograms (mcg) of folic acid. Do not use herbal medicines, illegal drugs, or medicines that are not approved by your provider. Eating and drinking While you're pregnant your body needs additional nutrition to help  support your growing baby. Talk with your provider about your nutritional needs. Activity Most women are able to exercise regularly during pregnancy. Exercise routines may need to change at the end of your pregnancy. Talk to your provider about your activities and exercise routine. Relieving pain and discomfort Rest often with your legs raised if you have leg cramps or low back pain. Take warm sitz baths to soothe pain from hemorrhoids. Use hemorrhoid cream if your provider says it's okay. Wear a good, supportive bra if your breasts hurt. Do not use hot tubs, steam rooms, or saunas. Do not douche. Do not use tampons or scented pads. Safety Talk to your provider before traveling far distances. Wear your seatbelt at all times when you're in a car. Talk to your provider if someone hits you, hurts you, or yells at you. Preparing for birth To prepare for your baby: Take childbirth and breastfeeding classes. Visit the hospital and tour the maternity area. Buy a rear-facing car seat. Learn how to install it in your car. General instructions Avoid cat litter boxes and soil used by cats. These things carry germs that can cause harm to your pregnancy and your baby. Do not drink alcohol, smoke, vape, or use products with nicotine or tobacco in them. If you need help quitting, talk with your provider. Keep all follow-up visits for your third trimester. Your provider will do more exams and tests during this trimester. Write down your questions. Take them to your prenatal visits. Your provider also will: Talk with you about  your overall health. Give you advice or refer you to specialists who can help with different needs, including: Mental health and counseling. Foods and healthy eating. Ask for help if you need help with food. Where to find more information American Pregnancy Association: americanpregnancy.org [employer] of Obstetricians and Gynecologists: acog.org Office on [employer] Health:  [employer] Contact a health care provider if: You have a headache that does not go away when you take medicine. You have any of these problems: You can't eat or drink. You have nausea and vomiting. You have watery poop (diarrhea) for 2 days or more. You have pain when you pee, or your pee smells bad. You have been sick for 2 days or more and aren't getting better. Contact your provider right away if: You have any of these coming from your vagina: Abnormal discharge. Bad-smelling fluid. Bleeding. Your baby is moving less than usual. You have signs of labor: You have any contractions, belly cramping, or have pain in your pelvis or lower back before 37 weeks of pregnancy (preterm labor). You have regular contractions that are less than 5 minutes apart. Your water breaks. You have symptoms of high blood pressure or preeclampsia. These include: A severe, throbbing headache that does not go away. Sudden or extreme swelling of your face, hands, legs, or feet. Vision problems: You see spots. You have blurry vision. Your eyes are sensitive to light. If you can't reach your provider, go to an urgent care or emergency room. Get help right away if: You faint, become confused, or can't think clearly. You have chest pain or trouble breathing. You have any kind of injury, such as from a fall or a car crash. These symptoms may be an emergency. Call 911 right away. Do not wait to see if the symptoms will go away. Do not drive yourself to the hospital. This information is not intended to replace advice given to you by your health care provider. Make sure you discuss any questions you have with your health care provider. Document Revised: 04/01/2023 Document Reviewed: 10/30/2022 Elsevier Patient Education  2024 [employer].

## 2024-03-21 ENCOUNTER — Ambulatory Visit: Admitting: Advanced Practice Midwife

## 2024-03-21 ENCOUNTER — Encounter: Payer: Self-pay | Admitting: Advanced Practice Midwife

## 2024-03-21 VITALS — BP 123/75 | HR 98 | Wt 146.7 lb

## 2024-03-21 DIAGNOSIS — Z3483 Encounter for supervision of other normal pregnancy, third trimester: Secondary | ICD-10-CM | POA: Diagnosis not present

## 2024-03-21 DIAGNOSIS — Z3A33 33 weeks gestation of pregnancy: Secondary | ICD-10-CM

## 2024-03-21 NOTE — Patient Instructions (Signed)
 Fetal Movement Counts When you're pregnant, you might start feeling your baby move around the middle of your pregnancy. At first, these movements might feel like flutters, rolls, or swishes. As your baby grows, you might feel more kicks and jabs. Around week 28 of your pregnancy, your health care team may ask you to count how often your baby moves. This is important for all pregnancies, but especially for high-risk ones. Counting movements can help lessen the risk of stillbirth. What is a fetal movement count? A fetal movement count is the number of times that you feel your baby move during a certain amount of time. This may also be called a kick count. There are many ways to do a kick count. Ask your team what is best for you. Pay attention to when your baby is most active. You may notice your baby's sleep and wake cycles. You may also notice things that make your baby move more. When you do a kick count, try to do it: When your baby is normally most active. At the same time each day. How do I count fetal movements?  Find a quiet, comfortable area. Sit or lie down. Write down the date, the start time, and the number of movements you feel. Count kicks, flutters, swishes, rolls, and jabs. Usually, you will feel at least 10 movements within 2 hours. Stop counting after you have felt 10 movements or if you have been counting for 2 hours. Write down the stop time. Contact a health care provider if: You don't feel 10 movements in 2 hours. Your baby isn't moving as it usually does. Your baby isn't moving at all. If you're not able to reach your provider, go to an emergency room. This information is not intended to replace advice given to you by your health care provider. Make sure you discuss any questions you have with your health care provider. Document Revised: 07/23/2023 Document Reviewed: 07/15/2022 Elsevier Patient Education  2025 [employer].

## 2024-03-21 NOTE — Progress Notes (Signed)
 Routine Prenatal Care Visit  Subjective  Jacqueline Villarreal is a 28 y.o. G2P0010 at [redacted]w[redacted]d being seen today for ongoing prenatal care.  She is currently monitored for the following issues for this low-risk pregnancy and has Anxiety about health; Ovarian cyst; Supervision of other normal pregnancy, antepartum; Skin rash; and False positive HIV serology on their problem list.  ----------------------------------------------------------------------------------- Patient reports feeling pelvic pressure especially at the end of her work day. She reports tiredness.    Contractions: Not present. Vag. Bleeding: None.  Movement: Present. Leaking Fluid denies.  ----------------------------------------------------------------------------------- The following portions of the patient's history were reviewed and updated as appropriate: allergies, current medications, past family history, past medical history, past social history, past surgical history and problem list. Problem list updated.  Objective  BP 123/75   Pulse 98   Wt 146 lb 11.2 oz (66.5 kg)   LMP 05/20/2023   BMI 25.18 kg/m  Pregravid weight 122 lb (55.3 kg) Total Weight Gain 24 lb 11.2 oz (11.2 kg) Urinalysis: Urine Protein    Urine Glucose    Fetal Status: Fetal Heart Rate (bpm): 138 Fundal Height: 33 cm Movement: Present      General:  Alert, oriented and cooperative. Patient is in no acute distress.  Skin: Skin is warm and dry. No rash noted.   Cardiovascular: Normal heart rate noted  Respiratory: Normal respiratory effort, no problems with respiration noted  Abdomen: Soft, gravid, appropriate for gestational age. Pain/Pressure: Present (pelvic pressure)     Pelvic:  Cervical exam deferred        Extremities: Normal range of motion.  Edema: None  Mental Status: Normal mood and affect. Normal behavior. Normal judgment and thought content.   Assessment   28 y.o. G2P0010 at [redacted]w[redacted]d by  05/09/2024, by Ultrasound presenting for routine  prenatal visit  Plan   G2 Problems (from 10/01/23 to present)     Problem Noted Diagnosed Resolved   False positive HIV serology 02/10/2024 by Jayne Harlene LITTIE, CNM  No   Overview Addendum 02/10/2024  5:11 PM by Jayne Harlene LITTIE, CNM  Order HIV-1/HIV-2 differentiation assay-not reflex testing due to false positive on initial testing      Supervision of other normal pregnancy, antepartum 10/01/2023 by Tamea Annalee DEL, CMA  No   Overview Addendum 03/21/2024  2:58 PM by Taft Salines, LPN   Clinical Staff Provider  Office Location  [facility] Dating  05/13/2024, by Ultrasound  Language  English Anatomy US   6/19-nml anat, breech, posterior placenta  Flu Vaccine  Declined  Genetic Screen  NIPS: low risk, xy  RSV Vaccine  Declined    Covid Vaccine  Declined    TDaP vaccine  02/22/24 Hgb A1C or  GTT Early : Third trimester :   Covid None   LAB RESULTS   Rhogam  B/Positive/-- (04/25 1048)  Blood Type B/Positive/-- (04/25 1048)   RSV Declined Antibody Negative (04/25 1048)  Feeding Plan Formula  Rubella 1.40 (04/25 1048)  Contraception none RPR Non Reactive (07/28 0000)   Circumcision Yes HBsAg Negative (04/25 1048)   Pediatrician  Maryl  HIV Preliminary Reactive (07/28 0000)  Support Person AUNT and FOB Varicella Reactive (04/25 1048)  Prenatal Classes Maybe GBS  (For PCN allergy, check sensitivities)     Hep C Non Reactive (04/25 1048)   BTL Consent N/A Pap No results found for: DIAGPAP  VBAC Consent N/A Hgb Electro      CF      SMA  Preterm labor symptoms and general obstetric precautions including but not limited to vaginal bleeding, contractions, leaking of fluid and fetal movement were reviewed in detail with the patient. Please refer to After Visit Summary for other counseling recommendations.   Return in about 2 weeks (around 04/04/2024) for rob.   Slater Rains, CNM 03/21/2024 3:22 PM

## 2024-03-31 NOTE — Progress Notes (Addendum)
    Return Prenatal Note   Subjective   28 y.o. G2P0010 at [redacted]w[redacted]d presents for this follow-up prenatal visit.  Patient  Patient reports: continues to have irration with urination, most recent urine culture negative, admits she does not always drink enough fluids discussed concentrated urine can cause irration. Pt concerned about PID because a provider at [facility] suggested in early pregnancy that she soul have PID-she is worried this could affect her baby or reproductive system. Discussed her swabs have been negative, she has not reported any sxs that suggest PID.   -Was seen in triage recently for blood in urine, urine culture was negative, reminded pt she may need follow up with urology if she continues to see blood in in urine, she has not seen blood in her urine since.   -has been having B-H contractions, encouraged use of support belt and hydration  -continues to have low back pain and sciatic pain, has not done home exercises.   -has some episodes of loose stool on Friday   -had an episode is seeing stars at the sides of her eyes, discussed hydration and and eating regularly-she eats 4 meals a day plus snacks.   Movement: Present Contractions: Irritability  Objective   Flow sheet Vitals: Pulse Rate: 92 BP: 103/69 Fundal Height: 32 cm Fetal Heart Rate (bpm): 135 Total weight gain: 28 lb 1.6 oz (12.7 kg)  General Appearance  No acute distress, well appearing, and well nourished Pulmonary   Normal work of breathing Neurologic   Alert and oriented to person, place, and time Psychiatric   Mood and affect within normal limits   Assessment/Plan   Plan  28 y.o. G2P0010 at [redacted]w[redacted]d presents for follow-up OB visit. Reviewed prenatal record including previous visit note.  Anxiety about health -Continues to have worrisome thoughts about her or the fetus's health -encouraged therapy and mindfulness activities   Supervision of other normal pregnancy, antepartum -TWG 28lbs, WNL -FH  32, growth US  ordered, discussed more than likely this is normally grown fetus, but to be safe we will check on the growth and fluid volume.  -36wk labs next visit  -reminded pt she should try the stretches for sciatica, she may need PT  -warning signs reviewed       Orders Placed This Encounter  Procedures   US  OB Follow Up    Standing Status:   Future    Expected Date:   04/11/2024    Expiration Date:   04/04/2025    Reason for exam::   S<D    Preferred imaging location?:   Internal   Ambulatory referral to Physical Therapy    Referral Priority:   Routine    Referral Type:   Physical Medicine    Referral Reason:   Specialty Services Required    Requested Specialty:   Physical Therapy    Number of Visits Requested:   1   Return in about 1 week (around 04/11/2024) for ROB, 36 wk labs.   Future Appointments  Date Time Provider Department Center  04/10/2024  3:30 PM [facility] US  1 [facility] None  04/10/2024  4:15 PM Charma Domino, CNM [facility] None  04/18/2024  3:35 PM Jayne Harlene CROME, CNM [facility] None  04/25/2024  3:55 PM Leigh Sober, MD [facility] None  05/02/2024  3:55 PM Starla Harland BROCKS, MD [facility] None    For next visit:  ROB with GBS screening       M , CNM  09/25/255:09 PM

## 2024-04-04 ENCOUNTER — Encounter: Payer: Self-pay | Admitting: Licensed Practical Nurse

## 2024-04-04 ENCOUNTER — Ambulatory Visit: Admitting: Licensed Practical Nurse

## 2024-04-04 VITALS — BP 103/69 | HR 92 | Wt 150.1 lb

## 2024-04-04 DIAGNOSIS — Z3480 Encounter for supervision of other normal pregnancy, unspecified trimester: Secondary | ICD-10-CM

## 2024-04-04 DIAGNOSIS — O26843 Uterine size-date discrepancy, third trimester: Secondary | ICD-10-CM

## 2024-04-04 DIAGNOSIS — Z3A35 35 weeks gestation of pregnancy: Secondary | ICD-10-CM

## 2024-04-04 DIAGNOSIS — Z3483 Encounter for supervision of other normal pregnancy, third trimester: Secondary | ICD-10-CM

## 2024-04-04 DIAGNOSIS — M5432 Sciatica, left side: Secondary | ICD-10-CM

## 2024-04-04 DIAGNOSIS — R4589 Other symptoms and signs involving emotional state: Secondary | ICD-10-CM

## 2024-04-04 NOTE — Progress Notes (Unsigned)
 Jacqueline Villarreal

## 2024-04-06 NOTE — Assessment & Plan Note (Signed)
-  Continues to have worrisome thoughts about her or the fetus's health -encouraged therapy and mindfulness activities

## 2024-04-06 NOTE — Assessment & Plan Note (Signed)
-  TWG 28lbs, WNL -FH 32, growth US  ordered, discussed more than likely this is normally grown fetus, but to be safe we will check on the growth and fluid volume.  -36wk labs next visit  -reminded pt she should try the stretches for sciatica, she may need PT  -warning signs reviewed

## 2024-04-07 ENCOUNTER — Encounter: Payer: Self-pay | Admitting: Certified Nurse Midwife

## 2024-04-07 NOTE — Progress Notes (Signed)
    Return Prenatal Note   Subjective   28 y.o. G2P0010 at [redacted]w[redacted]d presents for this follow-up prenatal visit.  Patient had growth scan today, which was normal. Feels peer and family support are most helpful to her in terms of her anxiety. She feels those have been helpful to her in pregnancy. Does not think that therapy would be helpful. Decided not to pursue PT for her back pain. Using yoga ball for stretches. Would like note for work for pregnancy accommodations. 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 She's here with her aunt. Noting increased uterine activity, but nothing timeable.   Patient reports: Movement: Present Contractions: Irritability  Objective   Flow sheet Vitals: Pulse Rate: 91 BP: 112/80 Fetal Heart Rate (bpm): 152 Presentation: Vertex (on growth scan/ ultrasound today) Total weight gain: 29 lb 9.6 oz (13.4 kg)  General Appearance  No acute distress, well appearing, and well nourished Pulmonary   Normal work of breathing Neurologic   Alert and oriented to person, place, and time Psychiatric   Mood and affect within normal limits   Assessment/Plan   Plan  28 y.o. G2P0010 at [redacted]w[redacted]d presents for follow-up OB visit. Reviewed prenatal record including previous visit note.  Supervision of other normal pregnancy, antepartum -Reviewed normal growth scan today: EFW 65%tile, AFI 15.3cm, cephalic. Provided reassurance. -GBS self swabbed today.  -RTC weekly for ROB.     Orders Placed This Encounter  Procedures   Strep Gp B NAA   Return in about 1 week (around 04/17/2024) for ROB.   Future Appointments  Date Time Provider Department Center  04/10/2024  4:15 PM Charma Domino, CNM [facility] None  04/18/2024  3:35 PM Jayne Harlene CROME, CNM [facility] None  04/25/2024  3:55 PM Leigh Sober, MD [facility] None  05/02/2024  3:55 PM Starla Harland BROCKS, MD [facility] None    For next visit:  continue with routine prenatal care   Domino Charma, CNM  09/29/254:08 PM

## 2024-04-10 ENCOUNTER — Ambulatory Visit (INDEPENDENT_AMBULATORY_CARE_PROVIDER_SITE_OTHER): Admitting: Registered Nurse

## 2024-04-10 ENCOUNTER — Ambulatory Visit

## 2024-04-10 ENCOUNTER — Other Ambulatory Visit (HOSPITAL_COMMUNITY)
Admission: RE | Admit: 2024-04-10 | Discharge: 2024-04-10 | Disposition: A | Discharge status: Home / Self Care | Source: Ambulatory Visit | Attending: Registered Nurse | Admitting: Registered Nurse

## 2024-04-10 VITALS — BP 112/80 | HR 91 | Wt 151.6 lb

## 2024-04-10 DIAGNOSIS — Z113 Encounter for screening for infections with a predominantly sexual mode of transmission: Secondary | ICD-10-CM | POA: Insufficient documentation

## 2024-04-10 DIAGNOSIS — Z3480 Encounter for supervision of other normal pregnancy, unspecified trimester: Secondary | ICD-10-CM

## 2024-04-10 DIAGNOSIS — Z3A35 35 weeks gestation of pregnancy: Secondary | ICD-10-CM | POA: Insufficient documentation

## 2024-04-10 DIAGNOSIS — Z3A36 36 weeks gestation of pregnancy: Secondary | ICD-10-CM

## 2024-04-10 DIAGNOSIS — O26843 Uterine size-date discrepancy, third trimester: Secondary | ICD-10-CM | POA: Diagnosis not present

## 2024-04-10 DIAGNOSIS — Z3483 Encounter for supervision of other normal pregnancy, third trimester: Secondary | ICD-10-CM

## 2024-04-10 DIAGNOSIS — Z3685 Encounter for antenatal screening for Streptococcus B: Secondary | ICD-10-CM | POA: Insufficient documentation

## 2024-04-10 NOTE — Patient Instructions (Signed)
 Signs and Symptoms of Labor Labor is the body's natural process of moving the baby and the placenta out of the uterus. The process of labor usually starts when the baby is full-term, between 47 and 41 weeks of pregnancy. Signs and symptoms that you are close to going into labor As your body prepares for labor and the birth of your baby, you may notice the following symptoms in the weeks and days before true labor starts: Passing a small amount of thick, bloody mucus from your vagina. This is called normal bloody show or losing your mucus plug. This may happen more than a week before labor begins, or right before labor begins, as the opening of the cervix starts to widen (dilate). For some women, the entire mucus plug passes at once. For others, pieces of the mucus plug may gradually pass over several days. Your baby moving (dropping) lower in your pelvis to get into position for birth (lightening). When this happens, you may feel more pressure on your bladder and pelvic bone and less pressure on your ribs. This may make it easier to breathe. It may also cause you to need to urinate more often and have problems with bowel movements. Having practice contractions, also called Braxton Hicks contractions or false labor. These occur at irregular (unevenly spaced) intervals that are more than 10 minutes apart. False labor contractions are common after exercise or sexual activity. They will stop if you change position, rest, or drink fluids. These contractions are usually mild and do not get stronger over time. They may feel like: A backache or back pain. Mild cramps, similar to menstrual cramps. Tightening or pressure in your abdomen. Other early symptoms include: Nausea or loss of appetite. Diarrhea. Having a sudden burst of energy, or feeling very tired. Mood changes. Having trouble sleeping. Signs and symptoms that labor has begun Signs that you are in labor may include: Having contractions that come  at regular (evenly spaced) intervals and increase in intensity. This may feel like more intense tightening or pressure in your abdomen that moves to your back. Contractions may also feel like rhythmic pain in your upper thighs or back that comes and goes at regular intervals. If you are delivering for the first time, this change in intensity of contractions often occurs at a more gradual pace. If you have given birth before, you may notice a more rapid progression of contraction changes. Feeling pressure in the vaginal area. Your water breaking (rupture of membranes). This is when the sac of fluid that surrounds your baby breaks. Fluid leaking from your vagina may be clear or blood-tinged. Labor usually starts within 24 hours of your water breaking, but it may take longer to begin. Some people may feel a sudden gush of fluid; others may notice repeatedly damp underwear. Follow these instructions at home:  When labor starts, or if your water breaks, call your health care provider or nurse care line. Based on your situation, they will determine when you should go in for an exam. During early labor, you may be able to rest and manage symptoms at home. Some strategies to try at home include: Breathing and relaxation techniques. Taking a warm bath or shower. Listening to music. Using a heating pad on the lower back for pain. If directed, apply heat to the area as often as told by your health care provider. Use the heat source that your health care provider recommends, such as a moist heat pack or a heating pad. Place a  towel between your skin and the heat source. Leave the heat on for 20-30 minutes. Remove the heat if your skin turns bright red. This is especially important if you are unable to feel pain, heat, or cold. You have a greater risk of getting burned. Contact a health care provider if: Your labor has started. Your water breaks. You have nausea, vomiting, or diarrhea. Get help right away  if: You have painful, regular contractions that are 5 minutes apart or less. Labor starts before you are [redacted] weeks along in your pregnancy. You have a fever. You have bright red blood coming from your vagina. You do not feel your baby moving. You have a severe headache with or without vision problems. You have chest pain or shortness of breath. These symptoms may represent a serious problem that is an emergency. Do not wait to see if the symptoms will go away. Get medical help right away. Call your local emergency services (911 in the [place] Do not drive yourself to the hospital. Summary Labor is your body's natural process of moving your baby and the placenta out of your uterus. The process of labor usually starts when your baby is full-term, between 45 and 40 weeks of pregnancy. When labor starts, or if your water breaks, call your health care provider or nurse care line. Based on your situation, they will determine when you should go in for an exam. This information is not intended to replace advice given to you by your health care provider. Make sure you discuss any questions you have with your health care provider. Document Revised: 11/12/2020 Document Reviewed: 11/12/2020 Elsevier Patient Education  2024 [employer]Group B Streptococcus Infection During Pregnancy Group B Streptococcus (GBS) is a type of bacteria that is often found in healthy people. It is commonly found in the rectum, vagina, and intestines. In people who are healthy and not pregnant, the bacteria rarely cause serious illness or complications. However, women who test positive for GBS during pregnancy can pass the bacteria to the baby during childbirth. This can cause serious infection in the baby after birth. Women with GBS may also have infections during their pregnancy or soon after childbirth. The infections include urinary tract infections (UTIs) or infections of the uterus. GBS also increases a woman's risk of  complications during pregnancy, such as early labor or delivery, miscarriage, or stillbirth. Routine testing for GBS is recommended for all pregnant women. What are the causes? This condition is caused by bacteria called Streptococcus agalactiae. What increases the risk? You may have a higher risk for GBS infection during pregnancy if you had one during a past pregnancy. What are the signs or symptoms? In most cases, GBS infection does not cause symptoms in pregnant women. If symptoms exist, they may include: Labor that starts before the 37th week of pregnancy. A UTI or bladder infection. This may cause a fever, frequent urination, or pain and burning during urination. Fever during labor. There can also be a rapid heartbeat in the mother or baby. Rare but serious symptoms of a GBS infection in women include: Blood infection (septicemia). This may cause fever, chills, or confusion. Lung infection (pneumonia). This may cause fever, chills, cough, rapid breathing, chest pain, or difficulty breathing. Bone, joint, skin, or soft tissue infection. How is this diagnosed? You may be screened for GBS between week 35 and week 37 of pregnancy. If you have symptoms of preterm labor, you may be screened earlier. This condition is diagnosed based on lab test  results from: A swab of fluid from the vagina and rectum. A urine sample. How is this treated? This condition is treated with antibiotic medicine. Antibiotic medicine may be given: To you when you go into labor, or as soon as your water breaks. The medicines will continue until after you give birth. If you are having a cesarean delivery, you do not need antibiotics unless your water has broken. To your baby, if he or she requires treatment. Your health care provider will check your baby to decide if he or she needs antibiotics to prevent a serious infection. Follow these instructions at home: Take over-the-counter and prescription medicines only as told  by your health care provider. Take your antibiotic medicine as told by your health care provider. Do not stop taking the antibiotic even if you start to feel better. Keep all pre-birth (prenatal) visits and follow-up visits as told by your health care provider. This is important. Contact a health care provider if: You have pain or burning when you urinate. You have to urinate more often than usual. You have a fever or chills. You develop a bad-smelling vaginal discharge. Get help right away if: Your water breaks. You go into labor. You have severe pain in your abdomen. You have difficulty breathing. You have chest pain. These symptoms may represent a serious problem that is an emergency. Do not wait to see if the symptoms will go away. Get medical help right away. Call your local emergency services (911 in the [place] Do not drive yourself to the hospital. Summary GBS is a type of bacteria that is common in healthy people. During pregnancy, colonization with GBS can cause serious complications for you or your baby. Your health care provider will screen you between 35 and 37 weeks of pregnancy to determine if you are colonized with GBS. If you are colonized with GBS during pregnancy, your health care provider will recommend antibiotics through an IV during labor. After delivery, your baby will be evaluated for complications related to potential GBS infection and may require antibiotics to prevent a serious infection. This information is not intended to replace advice given to you by your health care provider. Make sure you discuss any questions you have with your health care provider. Document Revised: 06/15/2022 Document Reviewed: 06/15/2022 Elsevier Patient Education  2024 [employer].

## 2024-04-10 NOTE — Assessment & Plan Note (Signed)
-  Reviewed normal growth scan today: EFW 65%tile, AFI 15.3cm, cephalic. Provided reassurance. -GBS self swabbed today.  -RTC weekly for ROB.

## 2024-04-12 LAB — CERVICOVAGINAL ANCILLARY ONLY
Chlamydia: NEGATIVE
Comment: NEGATIVE
Comment: NORMAL
Neisseria Gonorrhea: NEGATIVE

## 2024-04-12 LAB — STREP GP B NAA: Strep Gp B NAA: NEGATIVE

## 2024-04-13 ENCOUNTER — Ambulatory Visit (INDEPENDENT_AMBULATORY_CARE_PROVIDER_SITE_OTHER): Admitting: Certified Nurse Midwife

## 2024-04-13 VITALS — BP 116/83 | HR 91 | Wt 150.0 lb

## 2024-04-13 DIAGNOSIS — M545 Low back pain, unspecified: Secondary | ICD-10-CM | POA: Diagnosis not present

## 2024-04-13 DIAGNOSIS — Z3400 Encounter for supervision of normal first pregnancy, unspecified trimester: Secondary | ICD-10-CM | POA: Diagnosis not present

## 2024-04-13 LAB — POCT URINALYSIS DIPSTICK OB
Bilirubin, UA: NEGATIVE
Blood, UA: NEGATIVE
Glucose, UA: NEGATIVE
Ketones, UA: NEGATIVE
Nitrite, UA: NEGATIVE
POC,PROTEIN,UA: NEGATIVE
Spec Grav, UA: 1.01
Urobilinogen, UA: 0.2 U/dL
pH, UA: 6.5

## 2024-04-13 NOTE — Progress Notes (Unsigned)
    Return Prenatal Note   Subjective   28 y.o. G2P0010 at [redacted]w[redacted]d presents for this follow-up prenatal visit.  Patient reports more frequent cramping, back & stomach pain in the last few days, worse this morning. Active baby. Patient reports: lower back pain and frequent headache Movement: Present Contractions: Regular  Objective   Flow sheet Vitals: Pulse Rate: 91 BP: 116/83 Fundal Height: 36 cm Fetal Heart Rate (bpm): 135 Presentation: Vertex Dilation: Closed Effacement (%): Thick Station: -3 Total weight gain: 28 lb (12.7 kg)  General Appearance  No acute distress, well appearing, and well nourished Pulmonary   Normal work of breathing Neurologic   Alert and oriented to person, place, and time Psychiatric   Mood and affect within normal limits   Assessment/Plan   Plan  28 y.o. G2P0010 at [redacted]w[redacted]d presents for follow-up OB visit. Reviewed prenatal record including previous visit note.  Supervision of normal pregnancy Reassured of normalcy of cramping & more frequent irregular contractions at this point in pregnancy. Head low in pelvis which is also contributing. Call signs reviewed.      Orders Placed This Encounter  Procedures   Urine Culture   POC Urinalysis Dipstick OB   Return in 1 week (on 04/20/2024) for ROB.   Future Appointments  Date Time Provider Department Center  04/18/2024  3:35 PM Jayne Harlene CROME, CNM [facility] None  04/25/2024  3:55 PM Leigh Sober, MD [facility] None  05/02/2024  3:55 PM Starla Harland BROCKS, MD [facility] None    For next visit:  continue with routine prenatal care     Harlene CROME Jayne, CNM  10/02/254:12 PM

## 2024-04-14 NOTE — Patient Instructions (Signed)
 Signs and Symptoms of Labor Labor is the body's natural process of moving the baby and the placenta out of the uterus. The process of labor usually starts when the baby is full-term, between 74 and 41 weeks of pregnancy. Signs and symptoms that you are close to going into labor As your body prepares for labor and the birth of your baby, you may notice the following symptoms in the weeks and days before true labor starts: Passing a small amount of thick, bloody mucus from your vagina. This is called normal bloody show or losing your mucus plug. This may happen more than a week before labor begins, or right before labor begins, as the opening of the cervix starts to widen (dilate). For some women, the entire mucus plug passes at once. For others, pieces of the mucus plug may gradually pass over several days. Your baby moving (dropping) lower in your pelvis to get into position for birth (lightening). When this happens, you may feel more pressure on your bladder and pelvic bone and less pressure on your ribs. This may make it easier to breathe. It may also cause you to need to urinate more often and have problems with bowel movements. Having "practice contractions," also called Braxton Hicks contractions or false labor. These occur at irregular (unevenly spaced) intervals that are more than 10 minutes apart. False labor contractions are common after exercise or sexual activity. They will stop if you change position, rest, or drink fluids. These contractions are usually mild and do not get stronger over time. They may feel like: A backache or back pain. Mild cramps, similar to menstrual cramps. Tightening or pressure in your abdomen. Other early symptoms include: Nausea or loss of appetite. Diarrhea. Having a sudden burst of energy, or feeling very tired. Mood changes. Having trouble sleeping. Signs and symptoms that labor has begun Signs that you are in labor may include: Having contractions that come  at regular (evenly spaced) intervals and increase in intensity. This may feel like more intense tightening or pressure in your abdomen that moves to your back. Contractions may also feel like rhythmic pain in your upper thighs or back that comes and goes at regular intervals. If you are delivering for the first time, this change in intensity of contractions often occurs at a more gradual pace. If you have given birth before, you may notice a more rapid progression of contraction changes. Feeling pressure in the vaginal area. Your water breaking (rupture of membranes). This is when the sac of fluid that surrounds your baby breaks. Fluid leaking from your vagina may be clear or blood-tinged. Labor usually starts within 24 hours of your water breaking, but it may take longer to begin. Some people may feel a sudden gush of fluid; others may notice repeatedly damp underwear. Follow these instructions at home:  When labor starts, or if your water breaks, call your health care provider or nurse care line. Based on your situation, they will determine when you should go in for an exam. During early labor, you may be able to rest and manage symptoms at home. Some strategies to try at home include: Breathing and relaxation techniques. Taking a warm bath or shower. Listening to music. Using a heating pad on the lower back for pain. If directed, apply heat to the area as often as told by your health care provider. Use the heat source that your health care provider recommends, such as a moist heat pack or a heating pad. Place a  towel between your skin and the heat source. Leave the heat on for 20-30 minutes. Remove the heat if your skin turns bright red. This is especially important if you are unable to feel pain, heat, or cold. You have a greater risk of getting burned. Contact a health care provider if: Your labor has started. Your water breaks. You have nausea, vomiting, or diarrhea. Get help right away  if: You have painful, regular contractions that are 5 minutes apart or less. Labor starts before you are [redacted] weeks along in your pregnancy. You have a fever. You have bright red blood coming from your vagina. You do not feel your baby moving. You have a severe headache with or without vision problems. You have chest pain or shortness of breath. These symptoms may represent a serious problem that is an emergency. Do not wait to see if the symptoms will go away. Get medical help right away. Call your local emergency services (911 in the [place] Do not drive yourself to the hospital. Summary Labor is your body's natural process of moving your baby and the placenta out of your uterus. The process of labor usually starts when your baby is full-term, between 25 and 40 weeks of pregnancy. When labor starts, or if your water breaks, call your health care provider or nurse care line. Based on your situation, they will determine when you should go in for an exam. This information is not intended to replace advice given to you by your health care provider. Make sure you discuss any questions you have with your health care provider. Document Revised: 11/12/2020 Document Reviewed: 11/12/2020 Elsevier Patient Education  2024 [employer].

## 2024-04-14 NOTE — Assessment & Plan Note (Signed)
 Reassured of normalcy of cramping & more frequent irregular contractions at this point in pregnancy. Head low in pelvis which is also contributing. Call signs reviewed.

## 2024-04-15 ENCOUNTER — Observation Stay
Admission: EM | Admit: 2024-04-15 | Discharge: 2024-04-15 | Disposition: A | Discharge status: Home / Self Care | Attending: Obstetrics | Admitting: Obstetrics

## 2024-04-15 ENCOUNTER — Encounter: Payer: Self-pay | Admitting: Obstetrics

## 2024-04-15 DIAGNOSIS — M25551 Pain in right hip: Secondary | ICD-10-CM | POA: Diagnosis not present

## 2024-04-15 DIAGNOSIS — Z3A36 36 weeks gestation of pregnancy: Secondary | ICD-10-CM

## 2024-04-15 DIAGNOSIS — O26893 Other specified pregnancy related conditions, third trimester: Secondary | ICD-10-CM | POA: Diagnosis present

## 2024-04-15 DIAGNOSIS — O368930 Maternal care for other specified fetal problems, third trimester, not applicable or unspecified: Secondary | ICD-10-CM | POA: Diagnosis not present

## 2024-04-15 DIAGNOSIS — R252 Cramp and spasm: Secondary | ICD-10-CM | POA: Diagnosis not present

## 2024-04-15 DIAGNOSIS — O99891 Other specified diseases and conditions complicating pregnancy: Secondary | ICD-10-CM | POA: Diagnosis not present

## 2024-04-15 LAB — URINE CULTURE: Organism ID, Bacteria: NO GROWTH

## 2024-04-15 MED ORDER — LACTATED RINGERS IV SOLN: INTRAVENOUS | Status: DC

## 2024-04-15 MED ORDER — LACTATED RINGERS IV SOLN: 500.0000 mL | INTRAVENOUS | Status: DC | PRN

## 2024-04-15 NOTE — OB Triage Provider Note (Signed)
      L&D OB Triage Note  SUBJECTIVE Jacqueline Villarreal is a 28 y.o. G81P0010 female at [redacted]w[redacted]d, EDD Estimated Date of Delivery: 05/09/24 who presented to triage with complaints of with cramping and right hip pain that has now improved. She states she got in the tub and noted that the baby shifted and her hip pain has now improved. She denies loss of fluid, vaginal bleeding , and is feeling good movement. She is c/o occasional mild contractions. .   OB History  Gravida Para Term Preterm AB Living  2 0 0 0 1 0  SAB IAB Ectopic Multiple Live Births  1 0 0 0 0    # Outcome Date GA Lbr Len/2nd Weight Sex Type Anes PTL Lv  2 Current           1 SAB 2024 [redacted]w[redacted]d           Medications Prior to Admission  Medication Sig Dispense Refill Last Dose/Taking   Prenatal Vit-Fe Fumarate-FA (PRENATAL MULTIVITAMIN) TABS tablet Take 1 tablet by mouth daily at 12 noon.      sulfamethoxazole -trimethoprim  (BACTRIM ) 200-40 MG/5ML suspension Take 20 mLs by mouth every 12 (twelve) hours. (Patient not taking: Reported on 04/13/2024) 100 mL 0      OBJECTIVE  Nursing Evaluation:   BP 117/76 (BP Location: Left Arm)   Pulse (!) 102   Temp 98.3 F (36.8 C) (Oral)   Resp 17   LMP 05/20/2023    Findings:   irregular mild contractions      NST was performed and has been reviewed by me.  NST INTERPRETATION: Category I  Baseline 120 Moderate variability Accelerations present Decelerations absent Toco 4-10 min,   ASSESSMENT Impression:  1.  Pregnancy:  G2P0010 at [redacted]w[redacted]d , EDD Estimated Date of Delivery: 05/09/24 2.  Reassuring fetal and maternal status 3.  Pt declines labor evaluation and cervical exam. State she is feeling better and requests to go home.   PLAN 1. Current condition and above findings reviewed.  Reassuring fetal and maternal condition. 2. Discharge home with standard labor precautions given to return to L&D or call the office for problems. 3. Continue routine prenatal care.     Zelda Hummer, CNM

## 2024-04-15 NOTE — OB Triage Note (Signed)
 Jacqueline Villarreal 27 y.o. @G2P0  @36w4dGA   presents to Labor & Delivery triage via wheelchair steered by ED staff reporting cramping/contractions that began around 9pm tonight. She rates her pain a 4-5 out of 10. She states that when the cramping began she also had right hip pain that has now resolved.She denies signs and symptoms consistent with rupture of membranes or active vaginal bleeding. She states positive fetal movement. External FM and TOCO applied to non-tender abdomen. Initial FHR 145. Vital signs obtained and within normal limits. Patient oriented to care environment including call bell and bed control use. Zelda Hummer, CNM notified of patient's arrival.

## 2024-04-15 NOTE — OB Triage Note (Signed)
 Discharge instructions provided to patient. Patient verbalized understanding. Pt educated on signs and symptoms of labor, vaginal bleeding, LOF, and fetal movement. Red flag signs reviewed by RN. Patient discharged home with significant other in stable condition.

## 2024-04-17 ENCOUNTER — Ambulatory Visit: Payer: Self-pay | Admitting: Certified Nurse Midwife

## 2024-04-17 NOTE — Progress Notes (Unsigned)
    Return Prenatal Note   Subjective   28 y.o. G2P0010 at [redacted]w[redacted]d presents for this follow-up prenatal visit.  Patient feeling well, nested over the weekend. Some increased foot swelling today after being at work. Patient reports: Movement: Present Contractions: Irregular  Objective   Flow sheet Vitals: Pulse Rate: 84 BP: 129/84 Fundal Height: 37 cm Fetal Heart Rate (bpm): 135 Presentation: Vertex (Leopold's) Total weight gain: 34 lb 6.4 oz (15.6 kg)  General Appearance  No acute distress, well appearing, and well nourished Pulmonary   Normal work of breathing Neurologic   Alert and oriented to person, place, and time Psychiatric   Mood and affect within normal limits   Assessment/Plan   Plan  28 y.o. G2P0010 at [redacted]w[redacted]d presents for follow-up OB visit. Reviewed prenatal record including previous visit note.  Supervision of normal pregnancy Labor, call signs reviewed. Planning vasectomy for contraception postpartum.      No orders of the defined types were placed in this encounter.  No follow-ups on file.   Future Appointments  Date Time Provider Department Center  04/25/2024  3:55 PM Leigh Sober, MD [facility] None  05/02/2024  3:55 PM Starla Harland BROCKS, MD [facility] None    For next visit:  continue with routine prenatal care     Harlene LITTIE Cisco, CNM  10/07/255:41 PM

## 2024-04-18 ENCOUNTER — Ambulatory Visit (INDEPENDENT_AMBULATORY_CARE_PROVIDER_SITE_OTHER): Admitting: Certified Nurse Midwife

## 2024-04-18 VITALS — BP 129/84 | HR 84 | Wt 156.4 lb

## 2024-04-18 DIAGNOSIS — Z3A37 37 weeks gestation of pregnancy: Secondary | ICD-10-CM

## 2024-04-18 DIAGNOSIS — Z3403 Encounter for supervision of normal first pregnancy, third trimester: Secondary | ICD-10-CM

## 2024-04-18 NOTE — Patient Instructions (Signed)
 Signs and Symptoms of Labor Labor is the body's natural process of moving the baby and the placenta out of the uterus. The process of labor usually starts when the baby is full-term, between 74 and 41 weeks of pregnancy. Signs and symptoms that you are close to going into labor As your body prepares for labor and the birth of your baby, you may notice the following symptoms in the weeks and days before true labor starts: Passing a small amount of thick, bloody mucus from your vagina. This is called normal bloody show or losing your mucus plug. This may happen more than a week before labor begins, or right before labor begins, as the opening of the cervix starts to widen (dilate). For some women, the entire mucus plug passes at once. For others, pieces of the mucus plug may gradually pass over several days. Your baby moving (dropping) lower in your pelvis to get into position for birth (lightening). When this happens, you may feel more pressure on your bladder and pelvic bone and less pressure on your ribs. This may make it easier to breathe. It may also cause you to need to urinate more often and have problems with bowel movements. Having "practice contractions," also called Braxton Hicks contractions or false labor. These occur at irregular (unevenly spaced) intervals that are more than 10 minutes apart. False labor contractions are common after exercise or sexual activity. They will stop if you change position, rest, or drink fluids. These contractions are usually mild and do not get stronger over time. They may feel like: A backache or back pain. Mild cramps, similar to menstrual cramps. Tightening or pressure in your abdomen. Other early symptoms include: Nausea or loss of appetite. Diarrhea. Having a sudden burst of energy, or feeling very tired. Mood changes. Having trouble sleeping. Signs and symptoms that labor has begun Signs that you are in labor may include: Having contractions that come  at regular (evenly spaced) intervals and increase in intensity. This may feel like more intense tightening or pressure in your abdomen that moves to your back. Contractions may also feel like rhythmic pain in your upper thighs or back that comes and goes at regular intervals. If you are delivering for the first time, this change in intensity of contractions often occurs at a more gradual pace. If you have given birth before, you may notice a more rapid progression of contraction changes. Feeling pressure in the vaginal area. Your water breaking (rupture of membranes). This is when the sac of fluid that surrounds your baby breaks. Fluid leaking from your vagina may be clear or blood-tinged. Labor usually starts within 24 hours of your water breaking, but it may take longer to begin. Some people may feel a sudden gush of fluid; others may notice repeatedly damp underwear. Follow these instructions at home:  When labor starts, or if your water breaks, call your health care provider or nurse care line. Based on your situation, they will determine when you should go in for an exam. During early labor, you may be able to rest and manage symptoms at home. Some strategies to try at home include: Breathing and relaxation techniques. Taking a warm bath or shower. Listening to music. Using a heating pad on the lower back for pain. If directed, apply heat to the area as often as told by your health care provider. Use the heat source that your health care provider recommends, such as a moist heat pack or a heating pad. Place a  towel between your skin and the heat source. Leave the heat on for 20-30 minutes. Remove the heat if your skin turns bright red. This is especially important if you are unable to feel pain, heat, or cold. You have a greater risk of getting burned. Contact a health care provider if: Your labor has started. Your water breaks. You have nausea, vomiting, or diarrhea. Get help right away  if: You have painful, regular contractions that are 5 minutes apart or less. Labor starts before you are [redacted] weeks along in your pregnancy. You have a fever. You have bright red blood coming from your vagina. You do not feel your baby moving. You have a severe headache with or without vision problems. You have chest pain or shortness of breath. These symptoms may represent a serious problem that is an emergency. Do not wait to see if the symptoms will go away. Get medical help right away. Call your local emergency services (911 in the [place] Do not drive yourself to the hospital. Summary Labor is your body's natural process of moving your baby and the placenta out of your uterus. The process of labor usually starts when your baby is full-term, between 25 and 40 weeks of pregnancy. When labor starts, or if your water breaks, call your health care provider or nurse care line. Based on your situation, they will determine when you should go in for an exam. This information is not intended to replace advice given to you by your health care provider. Make sure you discuss any questions you have with your health care provider. Document Revised: 11/12/2020 Document Reviewed: 11/12/2020 Elsevier Patient Education  2024 [employer].

## 2024-04-18 NOTE — Assessment & Plan Note (Signed)
 Labor, call signs reviewed. Planning vasectomy for contraception postpartum.

## 2024-04-20 ENCOUNTER — Telehealth: Payer: Self-pay

## 2024-04-20 NOTE — Telephone Encounter (Signed)
 Patient called triage to report numbness and tingling in her hands.  She says she feels like her hands are falling asleep all the time and changing positions does not help wake them up.  It comes and goes but is happening more frequently now, especially when lifting her water bottle or driving with her hands on the steering wheel.  The numbness/tingling is bilateral but she notes it more often in the right.  Advised fluid shifts during pregnancy can put pressure on the nerves or her hands causing numbness and tingling.  Recommended she buy wrist braces from the pharmacy to help relieve pressure.  Also advised she can ice her wrists and see if that helps.    In addition, she reports having 7 strong contractions in 30 minutes on her way to work this morning and she had to pull over.  She is experiencing what feels like menstrual cramping low in her pelvis.  Discussed labor precautions and when to go to L&D.  Recommended she have someone she can call in case she is having contractions while driving again.  Patient works 45 minutes from [facility] so advised she can always call EMS if she is driving and goes into labor.

## 2024-04-23 ENCOUNTER — Observation Stay
Admission: EM | Admit: 2024-04-23 | Discharge: 2024-04-23 | Disposition: A | Discharge status: Home / Self Care | Attending: Obstetrics | Admitting: Obstetrics

## 2024-04-23 ENCOUNTER — Other Ambulatory Visit: Payer: Self-pay

## 2024-04-23 ENCOUNTER — Encounter: Payer: Self-pay | Admitting: Obstetrics

## 2024-04-23 DIAGNOSIS — O26893 Other specified pregnancy related conditions, third trimester: Principal | ICD-10-CM | POA: Insufficient documentation

## 2024-04-23 DIAGNOSIS — M545 Low back pain, unspecified: Secondary | ICD-10-CM | POA: Insufficient documentation

## 2024-04-23 DIAGNOSIS — Z3A37 37 weeks gestation of pregnancy: Secondary | ICD-10-CM | POA: Diagnosis not present

## 2024-04-23 DIAGNOSIS — Z3483 Encounter for supervision of other normal pregnancy, third trimester: Principal | ICD-10-CM

## 2024-04-23 DIAGNOSIS — Z789 Other specified health status: Secondary | ICD-10-CM

## 2024-04-23 LAB — WET PREP, GENITAL
Clue Cells Wet Prep HPF POC: NONE SEEN
Sperm: NONE SEEN
Trich, Wet Prep: NONE SEEN
WBC, Wet Prep HPF POC: <10 (ref <10)
Yeast Wet Prep HPF POC: NONE SEEN

## 2024-04-23 LAB — RUPTURE OF MEMBRANE (ROM)PLUS: Rom Plus: NEGATIVE

## 2024-04-23 MED ORDER — PRENATAL MULTIVITAMIN CH
1.0000 | ORAL_TABLET | Freq: Every day | ORAL | Status: DC
  Filled 2024-04-23: qty 1

## 2024-04-23 NOTE — Discharge Summary (Signed)
 LABOR & DELIVERY OB TRIAGE NOTE  SUBJECTIVE  HPI Jacqueline Villarreal is a 28 y.o. G2P0010 at [redacted]w[redacted]d who presents to Labor & Delivery for LOF and back pain. She had a gush of clear fluid last night. She has not continued to leak. She is feeling some mild contractions.  OB History     Gravida  2   Para  0   Term  0   Preterm  0   AB  1   Living  0      SAB  1   IAB  0   Ectopic  0   Multiple  0   Live Births  0           Scheduled Meds:  prenatal multivitamin  1 tablet Oral Q1200   Continuous Infusions: PRN Meds:.  OBJECTIVE  BP 121/67   Pulse 63   Temp 97.6 F (36.4 C) (Oral)   Resp 15   Ht 5' 4 (1.626 m)   Wt 70.8 kg   LMP 05/20/2023   BMI 26.78 kg/m   ROM Plus: negative Wet prep: negative  Cervical exam: deferred  NST I reviewed the NST and it was reactive.  Baseline: 130 Variability: moderate Accelerations: present Decelerations:none Toco: irregular Category  ASSESSMENT Impression  1) Pregnancy at G2P0010, [redacted]w[redacted]d, Estimated Date of Delivery: 05/09/24 2) Reassuring maternal/fetal status 3) Intact membranes, not in labor   PLAN 1) Discharge home with standard labor/return precautions 2) Keep scheduled ROB appt  Eleanor Canny, CNM 04/23/24  9:04 AM

## 2024-04-23 NOTE — OB Triage Note (Signed)
 Discharge instructions, labor precautions, and follow-up care reviewed with patient and significant other. All questions answered. Patient verbalized understanding. Discharged ambulatory off unit.

## 2024-04-23 NOTE — OB Triage Note (Addendum)
 Jacqueline Villarreal 27 y.o. @[redacted]w[redacted]d  G2P0 presents to Labor & Delivery triage via wheelchair steered by ED staff reporting lower back pain and leaking of fluid that she noticed around 2100 on 10/11. She states the lower back pain was intermittent but now is just when she stands. She denies active vaginal bleeding. She is unsure if she is having contractions or not. She states positive fetal movement. External FM and TOCO applied to non-tender abdomen. Initial FHR 165. Vital signs obtained and within normal limits. Patient oriented to care environment including call bell and bed control use. Cowen, CNM notified of patient's arrival. Plan to send rom+ & wet prep.  Dominick Zertuche L. Marwin Primmer, RN BSN 04/23/2024 6:51 AM

## 2024-04-24 ENCOUNTER — Telehealth: Payer: Self-pay

## 2024-04-24 NOTE — Telephone Encounter (Signed)
 Chart reviewed. Patient reported to L&D as advised.

## 2024-04-24 NOTE — Telephone Encounter (Signed)
 SABRA

## 2024-04-25 ENCOUNTER — Ambulatory Visit (INDEPENDENT_AMBULATORY_CARE_PROVIDER_SITE_OTHER): Admitting: Obstetrics

## 2024-04-25 VITALS — BP 118/71 | HR 78 | Wt 158.7 lb

## 2024-04-25 DIAGNOSIS — Z3483 Encounter for supervision of other normal pregnancy, third trimester: Secondary | ICD-10-CM | POA: Diagnosis not present

## 2024-04-25 DIAGNOSIS — R4589 Other symptoms and signs involving emotional state: Secondary | ICD-10-CM | POA: Diagnosis not present

## 2024-04-25 NOTE — Progress Notes (Signed)
    Return Prenatal Note   Subjective  28 y.o. G2P0010 at [redacted]w[redacted]d presents for this follow-up prenatal visit. Pregnancy notable for health-related anxiety, false-positive HIV test, and hx of ectopic.   Patient would like her cervix checked today.  She was recently in OBT 04/23/24 for LOF, workup negative and sent home. Questions today about her new due date of 05/04/24 and why she's not dilating when she can see and feel contractions on EFM while in triage.   Patient reports: Movement: Present Contractions: Irregular Denies vaginal bleeding or leaking fluid. Objective  Flow sheet Vitals: Pulse Rate: 78 BP: 118/71 Fundal Height: 38 cm Fetal Heart Rate (bpm): 122 Dilation: Closed Effacement (%): 30 Station: -3 Total weight gain: 36 lb 11.2 oz (16.6 kg)  General Appearance  No acute distress, well appearing, and well nourished Pulmonary   Normal work of breathing Neurologic   Alert and oriented to person, place, and time Psychiatric   Mood and affect within normal limits   Assessment/Plan   Plan  27 y.o. G2P0010 at [redacted]w[redacted]d by 6wk US  presents for follow-up OB visit. Reviewed prenatal record including previous visit note.  1. Encounter for supervision of other normal pregnancy, third trimester (Primary) -Reviewed EDD of 05/09/24, is well-dated by a 6wk US . Discussed discrepancies in later ultrasounds, especially 3rd trimester. Her most recent done 9/29 at 36wks for S<D is consistent with her established EDD. We discussed acceptable margin of error and wound not change EDD. -Cervix 0/30/-3 today, we discussed process of effacement and dilation, and reassurance given for her contractions thus far. We reviewed 511 rule and labor precautions.  -Briefly discussed PD-IOL if not delivered by then, pt with questions about not laboring, did not schedule.   2. Anxiety about health -Encouraged pt to practice calming/coping techniques and to bring questions about her pregnancy/birth to us  at  future visits, cannot trust a lot of the information online/social media.   Return in about 1 week (around 05/02/2024) for ROB.   Future Appointments  Date Time Provider Department Center  05/02/2024  3:55 PM Starla Harland BROCKS, MD [facility] None    For next visit:  continue with routine prenatal care    Estil Mangle, DO [facility]

## 2024-04-25 NOTE — Patient Instructions (Signed)
 Third Trimester of Pregnancy  The third trimester of pregnancy is from week 28 through week 40. This is months 7 through 9. The third trimester is a time when your baby is growing fast. Body changes during your third trimester Your body continues to change during this time. The changes usually go away after your baby is born. Physical changes You will continue to gain weight. You may get stretch marks on your hips, belly, and breasts. Your breasts will keep growing and may hurt. A yellow fluid (colostrum) may leak from your breasts. This is the first milk you're making for your baby. Your hair may grow faster and get thicker. In some cases, you may get hair loss. Your belly button may stick out. You may have more swelling in your hands, face, or ankles. Health changes You may have heartburn. You may feel short of breath. This is caused by the uterus that is now bigger. You may have more aches in the pelvis, back, or thighs. You may have more tingling or numbness in your hands, arms, and legs. You may pee more often. You may have trouble pooping (constipation) or swollen veins in the butt that can itch or get painful (hemorrhoids). Other changes You may have more problems sleeping. You may notice the baby moving lower in your belly (dropping). You may have more fluid coming from your vagina. Your joints may feel loose, and you may have pain around your pelvic bone. Follow these instructions at home: Medicines Take medicines only as told by your health care provider. Some medicines are not safe during pregnancy. Your provider may change the medicines that you take. Do not take any medicines unless told to by your provider. Take a prenatal vitamin that has at least 600 micrograms (mcg) of folic acid. Do not use herbal medicines, illegal drugs, or medicines that are not approved by your provider. Eating and drinking While you're pregnant your body needs additional nutrition to help  support your growing baby. Talk with your provider about your nutritional needs. Activity Most women are able to exercise regularly during pregnancy. Exercise routines may need to change at the end of your pregnancy. Talk to your provider about your activities and exercise routine. Relieving pain and discomfort Rest often with your legs raised if you have leg cramps or low back pain. Take warm sitz baths to soothe pain from hemorrhoids. Use hemorrhoid cream if your provider says it's okay. Wear a good, supportive bra if your breasts hurt. Do not use hot tubs, steam rooms, or saunas. Do not douche. Do not use tampons or scented pads. Safety Talk to your provider before traveling far distances. Wear your seatbelt at all times when you're in a car. Talk to your provider if someone hits you, hurts you, or yells at you. Preparing for birth To prepare for your baby: Take childbirth and breastfeeding classes. Visit the hospital and tour the maternity area. Buy a rear-facing car seat. Learn how to install it in your car. General instructions Avoid cat litter boxes and soil used by cats. These things carry germs that can cause harm to your pregnancy and your baby. Do not drink alcohol, smoke, vape, or use products with nicotine or tobacco in them. If you need help quitting, talk with your provider. Keep all follow-up visits for your third trimester. Your provider will do more exams and tests during this trimester. Write down your questions. Take them to your prenatal visits. Your provider also will: Talk with you about  your overall health. Give you advice or refer you to specialists who can help with different needs, including: Mental health and counseling. Foods and healthy eating. Ask for help if you need help with food. Where to find more information American Pregnancy Association: americanpregnancy.org [employer] of Obstetricians and Gynecologists: acog.org Office on [employer] Health:  [employer] Contact a health care provider if: You have a headache that does not go away when you take medicine. You have any of these problems: You can't eat or drink. You have nausea and vomiting. You have watery poop (diarrhea) for 2 days or more. You have pain when you pee, or your pee smells bad. You have been sick for 2 days or more and aren't getting better. Contact your provider right away if: You have any of these coming from your vagina: Abnormal discharge. Bad-smelling fluid. Bleeding. Your baby is moving less than usual. You have signs of labor: You have any contractions, belly cramping, or have pain in your pelvis or lower back before 37 weeks of pregnancy (preterm labor). You have regular contractions that are less than 5 minutes apart. Your water breaks. You have symptoms of high blood pressure or preeclampsia. These include: A severe, throbbing headache that does not go away. Sudden or extreme swelling of your face, hands, legs, or feet. Vision problems: You see spots. You have blurry vision. Your eyes are sensitive to light. If you can't reach your provider, go to an urgent care or emergency room. Get help right away if: You faint, become confused, or can't think clearly. You have chest pain or trouble breathing. You have any kind of injury, such as from a fall or a car crash. These symptoms may be an emergency. Call 911 right away. Do not wait to see if the symptoms will go away. Do not drive yourself to the hospital. This information is not intended to replace advice given to you by your health care provider. Make sure you discuss any questions you have with your health care provider. Document Revised: 04/01/2023 Document Reviewed: 10/30/2022 Elsevier Patient Education  2024 [employer].

## 2024-04-27 ENCOUNTER — Ambulatory Visit: Payer: Self-pay | Admitting: Licensed Practical Nurse

## 2024-04-29 ENCOUNTER — Observation Stay
Admission: EM | Admit: 2024-04-29 | Discharge: 2024-04-30 | Disposition: A | Discharge status: Home / Self Care | Attending: Advanced Practice Midwife | Admitting: Advanced Practice Midwife

## 2024-04-29 ENCOUNTER — Encounter: Payer: Self-pay | Admitting: Obstetrics and Gynecology

## 2024-04-29 DIAGNOSIS — O368130 Decreased fetal movements, third trimester, not applicable or unspecified: Principal | ICD-10-CM | POA: Insufficient documentation

## 2024-04-29 DIAGNOSIS — Z789 Other specified health status: Secondary | ICD-10-CM

## 2024-04-29 DIAGNOSIS — Z3483 Encounter for supervision of other normal pregnancy, third trimester: Principal | ICD-10-CM

## 2024-04-29 DIAGNOSIS — O0001 Abdominal pregnancy with intrauterine pregnancy: Secondary | ICD-10-CM | POA: Insufficient documentation

## 2024-04-29 DIAGNOSIS — Z3A38 38 weeks gestation of pregnancy: Secondary | ICD-10-CM | POA: Insufficient documentation

## 2024-04-29 DIAGNOSIS — Z87891 Personal history of nicotine dependence: Secondary | ICD-10-CM | POA: Insufficient documentation

## 2024-04-30 ENCOUNTER — Other Ambulatory Visit: Payer: Self-pay

## 2024-04-30 DIAGNOSIS — O0001 Abdominal pregnancy with intrauterine pregnancy: Secondary | ICD-10-CM | POA: Diagnosis not present

## 2024-04-30 DIAGNOSIS — O368130 Decreased fetal movements, third trimester, not applicable or unspecified: Secondary | ICD-10-CM | POA: Diagnosis not present

## 2024-04-30 DIAGNOSIS — Z3A38 38 weeks gestation of pregnancy: Secondary | ICD-10-CM | POA: Diagnosis not present

## 2024-04-30 DIAGNOSIS — Z87891 Personal history of nicotine dependence: Secondary | ICD-10-CM | POA: Diagnosis not present

## 2024-04-30 NOTE — Discharge Summary (Signed)
 Physician Final Progress Note  Patient ID: Jacqueline Villarreal MRN: [id number] DOB/AGE: Apr 13, 1996 28 y.o.  Admit date: 04/29/2024 Admitting provider: Slater Rains, CNM Discharge date: 04/30/2024   Admission Diagnoses:  1) intrauterine pregnancy at [redacted]w[redacted]d  2) decreased fetal movement  Discharge Diagnoses:  Active Problems:   Labor and delivery, indication for care  Reactive NST  History of Present Illness: The patient is a 28 y.o. female G2P0010 at [redacted]w[redacted]d who presents for decreased fetal movement today. She reports feeling baby when arriving at triage. She was admitted for observation and placed on monitors. Reactive NST. Denies contractions, vaginal bleeding or leakage of fluid. She was discharged to home with instructions and precautions.   Past Medical History:  Diagnosis Date   Ovarian cyst     Past Surgical History:  Procedure Laterality Date   MOUTH SURGERY     when pt had braces    No current facility-administered medications on file prior to encounter.   Current Outpatient Medications on File Prior to Encounter  Medication Sig Dispense Refill   Prenatal Vit-Fe Fumarate-FA (PRENATAL MULTIVITAMIN) TABS tablet Take 1 tablet by mouth daily at 12 noon. (Patient not taking: No sig reported)      No Known Allergies  Social History   Socioeconomic History   Marital status: Significant Other    Spouse name: issac   Number of children: 0   Years of education: Not on file   Highest education level: GED or equivalent  Occupational History   Not on file  Tobacco Use   Smoking status: Former    Types: E-cigarettes   Smokeless tobacco: Never   Tobacco comments:    I vape every day but am decreasing the use  Vaping Use   Vaping status: Every Day   Substances: Nicotine , Flavoring  Substance and Sexual Activity   Alcohol use: Not Currently    Alcohol/week: 1.0 standard drink of alcohol    Types: 1 Glasses of wine per week    Comment: occasionally   Drug use: No    Sexual activity: Yes    Birth control/protection: Pill  Other Topics Concern   Not on file  Social History Narrative   Not on file   Social Drivers of Health   Financial Resource Strain: Low Risk  (10/01/2023)   Overall Financial Resource Strain (CARDIA)    Difficulty of Paying Living Expenses: Not very hard  Food Insecurity: No Food Insecurity (10/01/2023)   Hunger Vital Sign    Worried About Running Out of Food in the Last Year: Never true    Ran Out of Food in the Last Year: Never true  Transportation Needs: No Transportation Needs (10/01/2023)   PRAPARE - Administrator, Civil Service (Medical): No    Lack of Transportation (Non-Medical): No  Physical Activity: Sufficiently Active (10/01/2023)   Exercise Vital Sign    Days of Exercise per Week: 5 days    Minutes of Exercise per Session: 40 min  Stress: No Stress Concern Present (10/01/2023)   [employer] of Occupational Health - Occupational Stress Questionnaire    Feeling of Stress : Not at all  Social Connections: Moderately Integrated (10/01/2023)   Social Connection and Isolation Panel    Frequency of Communication with Friends and Family: More than three times a week    Frequency of Social Gatherings with Friends and Family: More than three times a week    Attends Religious Services: 1 to 4 times per year  Active Member of Clubs or Organizations: No    Attends Banker Meetings: Never    Marital Status: Living with partner  Intimate Partner Violence: Not At Risk (06/24/2023)   Humiliation, Afraid, Rape, and Kick questionnaire    Fear of Current or Ex-Partner: No    Emotionally Abused: No    Physically Abused: No    Sexually Abused: No    Family History  Problem Relation Age of Onset   Heart block Mother    Diabetes Mother    Diabetes Brother    Cancer Paternal Grandmother    Irritable bowel syndrome Paternal Grandmother    Breast cancer Paternal Aunt      Review of Systems   Constitutional:  Negative for chills and fever.  HENT:  Negative for congestion, ear discharge, ear pain, hearing loss, sinus pain and sore throat.   Eyes:  Negative for blurred vision and double vision.  Respiratory:  Negative for cough, shortness of breath and wheezing.   Cardiovascular:  Negative for chest pain, palpitations and leg swelling.  Gastrointestinal:  Negative for abdominal pain, blood in stool, constipation, diarrhea, heartburn, melena, nausea and vomiting.  Genitourinary:  Negative for dysuria, flank pain, frequency, hematuria and urgency.  Musculoskeletal:  Negative for back pain, joint pain and myalgias.  Skin:  Negative for itching and rash.  Neurological:  Negative for dizziness, tingling, tremors, sensory change, speech change, focal weakness, seizures, loss of consciousness, weakness and headaches.  Endo/Heme/Allergies:  Negative for environmental allergies. Does not bruise/bleed easily.  Psychiatric/Behavioral:  Negative for depression, hallucinations, memory loss, substance abuse and suicidal ideas. The patient is not nervous/anxious and does not have insomnia.      Physical Exam: BP 111/60   Pulse 79   Temp 97.9 F (36.6 C) (Oral)   LMP 05/20/2023   Constitutional: Well nourished, well developed female in no acute distress.  HEENT: normal Skin: Warm and dry.  Cardiovascular: Regular rate and rhythm.   Extremity: no edema  Respiratory:  Normal respiratory effort Abdomen: FHT present Psych: Alert and Oriented x3. No memory deficits. Normal mood and affect.   Fetal well being: reactive tracing, 125 bpm baseline, moderate variability, +accelerations, -decelerations Toco: irritability  Consults: None  Significant Findings/ Diagnostic Studies: None  Procedures: NST  Hospital Course: The patient was admitted to Labor and Delivery Triage for observation.   Discharge Condition: good  Disposition: Discharge disposition: 01-Home or Self Care  Diet: Regular  diet  Discharge Activity: Activity as tolerated  Discharge Instructions     Discharge activity:  No Restrictions   Complete by: As directed    Discharge diet:  No restrictions   Complete by: As directed       Allergies as of 04/30/2024   No Known Allergies      Medication List     TAKE these medications    prenatal multivitamin Tabs tablet Take 1 tablet by mouth daily at 12 noon.        Follow-up Information     [facility]. Go to.   Specialty: Obstetrics and Gynecology Why: scheduled prenatal appointment Contact information: [address] [place]  [address] [phone]                Total time spent taking care of this patient: 20 minutes  Signed: Slater Rains, CNM  04/30/2024, 12:17 AM

## 2024-04-30 NOTE — OB Triage Note (Signed)
 Patient is a  27yo, G2 P0, at 38 weeks 4 days. Patient presents with complaints of decreased fetal movement that resolved shortly after admission.  Patient denies any vaginal bleeding or LOF. Monitors applied and assessing. VSS. Initial fetal heart tone 135 bpm. [place], CNM notified of patients arrival to unit. Plan to complete NST

## 2024-05-02 ENCOUNTER — Encounter: Admitting: Obstetrics & Gynecology

## 2024-05-02 ENCOUNTER — Ambulatory Visit (INDEPENDENT_AMBULATORY_CARE_PROVIDER_SITE_OTHER): Admitting: Certified Nurse Midwife

## 2024-05-02 VITALS — BP 125/82 | HR 103 | Wt 159.5 lb

## 2024-05-02 DIAGNOSIS — Z3483 Encounter for supervision of other normal pregnancy, third trimester: Secondary | ICD-10-CM

## 2024-05-02 DIAGNOSIS — Z3A39 39 weeks gestation of pregnancy: Secondary | ICD-10-CM

## 2024-05-02 NOTE — Progress Notes (Signed)
    Return Prenatal Note   Subjective   28 y.o. G2P0010 at [redacted]w[redacted]d presents for this follow-up prenatal visit.  Patient is doing well. She does have concerns of leaking fluid and increased pelvic pain. She reports good fetal movement. Patient reports: Movement: Present Contractions: Irregular  Objective   Flow sheet Vitals: Pulse Rate: (!) 103 BP: 125/82 Fundal Height: 39 cm Fetal Heart Rate (bpm): 150 Presentation: Vertex ([place]) Total weight gain: 37 lb 8 oz (17 kg)  General Appearance  No acute distress, well appearing, and well nourished Pulmonary   Normal work of breathing Neurologic   Alert and oriented to person, place, and time Psychiatric   Mood and affect within normal limits   Assessment/Plan   Plan  28 y.o. G2P0010 at [redacted]w[redacted]d presents for follow-up OB visit. Reviewed prenatal record including previous visit note.  Supervision of normal pregnancy Reviewed what labor pain will feel like and how it is different than braxton hicks and generalized back pain. She is very nervous about birth but ready to be done with pregnancy.  Discussed 41 week IOL with [place]. She is open to plan. Will need to schedule it at [facility].  Reviewed labor warning signs and expectations for birth. Instructed to call office or come to hospital with persistent headache, vision changes, regular contractions, leaking of fluid, decreased fetal movement or vaginal bleeding.       No orders of the defined types were placed in this encounter.  No follow-ups on file.   Future Appointments  Date Time Provider Department Center  05/08/2024 10:35 AM Justino Eleanor HERO, CNM [facility] None     For next visit:  continue with routine prenatal care     Damien Parsley, CNM [facility] 10/21/254:46 PM

## 2024-05-02 NOTE — Patient Instructions (Signed)
 Third Trimester of Pregnancy  The third trimester of pregnancy is from week 28 through week 40. This is months 7 through 9. The third trimester is a time when your baby is growing fast. Body changes during your third trimester Your body continues to change during this time. The changes usually go away after your baby is born. Physical changes You will continue to gain weight. You may get stretch marks on your hips, belly, and breasts. Your breasts will keep growing and may hurt. A yellow fluid (colostrum) may leak from your breasts. This is the first milk you're making for your baby. Your hair may grow faster and get thicker. In some cases, you may get hair loss. Your belly button may stick out. You may have more swelling in your hands, face, or ankles. Health changes You may have heartburn. You may feel short of breath. This is caused by the uterus that is now bigger. You may have more aches in the pelvis, back, or thighs. You may have more tingling or numbness in your hands, arms, and legs. You may pee more often. You may have trouble pooping (constipation) or swollen veins in the butt that can itch or get painful (hemorrhoids). Other changes You may have more problems sleeping. You may notice the baby moving lower in your belly (dropping). You may have more fluid coming from your vagina. Your joints may feel loose, and you may have pain around your pelvic bone. Follow these instructions at home: Medicines Take medicines only as told by your health care provider. Some medicines are not safe during pregnancy. Your provider may change the medicines that you take. Do not take any medicines unless told to by your provider. Take a prenatal vitamin that has at least 600 micrograms (mcg) of folic acid . Do not use herbal medicines, illegal drugs, or medicines that are not approved by your provider. Eating and drinking While you're pregnant your body needs additional nutrition to help  support your growing baby. Talk with your provider about your nutritional needs. Activity Most women are able to exercise regularly during pregnancy. Exercise routines may need to change at the end of your pregnancy. Talk to your provider about your activities and exercise routine. Relieving pain and discomfort Rest often with your legs raised if you have leg cramps or low back pain. Take warm sitz baths to soothe pain from hemorrhoids. Use hemorrhoid cream if your provider says it's okay. Wear a good, supportive bra if your breasts hurt. Do not use hot tubs, steam rooms, or saunas. Do not douche. Do not use tampons or scented pads. Safety Talk to your provider before traveling far distances. Wear your seatbelt at all times when you're in a car. Talk to your provider if someone hits you, hurts you, or yells at you. Preparing for birth To prepare for your baby: Take childbirth and breastfeeding classes. Visit the hospital and tour the maternity area. Buy a rear-facing car seat. Learn how to install it in your car. General instructions Avoid cat litter boxes and soil used by cats. These things carry germs that can cause harm to your pregnancy and your baby. Do not drink alcohol, smoke, vape, or use products with nicotine  or tobacco in them. If you need help quitting, talk with your provider. Keep all follow-up visits for your third trimester. Your provider will do more exams and tests during this trimester. Write down your questions. Take them to your prenatal visits. Your provider also will: Talk with you about  your overall health. Give you advice or refer you to specialists who can help with different needs, including: Mental health and counseling. Foods and healthy eating. Ask for help if you need help with food. Where to find more information American Pregnancy Association: americanpregnancy.org [employer] of Obstetricians and Gynecologists: acog.org Office on [employer] Health:  [employer] Contact a health care provider if: You have a headache that does not go away when you take medicine. You have any of these problems: You can't eat or drink. You have nausea and vomiting. You have watery poop (diarrhea) for 2 days or more. You have pain when you pee, or your pee smells bad. You have been sick for 2 days or more and aren't getting better. Contact your provider right away if: You have any of these coming from your vagina: Abnormal discharge. Bad-smelling fluid. Bleeding. Your baby is moving less than usual. You have signs of labor: You have any contractions, belly cramping, or have pain in your pelvis or lower back before 37 weeks of pregnancy (preterm labor). You have regular contractions that are less than 5 minutes apart. Your water breaks. You have symptoms of high blood pressure or preeclampsia. These include: A severe, throbbing headache that does not go away. Sudden or extreme swelling of your face, hands, legs, or feet. Vision problems: You see spots. You have blurry vision. Your eyes are sensitive to light. If you can't reach your provider, go to an urgent care or emergency room. Get help right away if: You faint, become confused, or can't think clearly. You have chest pain or trouble breathing. You have any kind of injury, such as from a fall or a car crash. These symptoms may be an emergency. Call 911 right away. Do not wait to see if the symptoms will go away. Do not drive yourself to the hospital. This information is not intended to replace advice given to you by your health care provider. Make sure you discuss any questions you have with your health care provider. Document Revised: 04/01/2023 Document Reviewed: 10/30/2022 Elsevier Patient Education  2024 Elsevier Inc.Fetal Movement Counts When you're pregnant, you might start feeling your baby move around the middle of your pregnancy. At first, these movements might feel like  flutters, rolls, or swishes. As your baby grows, you might feel more kicks and jabs. Around week 28 of your pregnancy, your health care team may ask you to count how often your baby moves. This is important for all pregnancies, but especially for high-risk ones. Counting movements can help lessen the risk of stillbirth. What is a fetal movement count? A fetal movement count is the number of times that you feel your baby move during a certain amount of time. This may also be called a kick count. There are many ways to do a kick count. Ask your team what is best for you. Pay attention to when your baby is most active. You may notice your baby's sleep and wake cycles. You may also notice things that make your baby move more. When you do a kick count, try to do it: When your baby is normally most active. At the same time each day. How do I count fetal movements?  Find a quiet, comfortable area. Sit or lie down. Write down the date, the start time, and the number of movements you feel. Count kicks, flutters, swishes, rolls, and jabs. Usually, you will feel at least 10 movements within 2 hours. Stop counting after you have felt 10 movements  or if you have been counting for 2 hours. Write down the stop time. Contact a health care provider if: You don't feel 10 movements in 2 hours. Your baby isn't moving as it usually does. Your baby isn't moving at all. If you're not able to reach your provider, go to an emergency room. This information is not intended to replace advice given to you by your health care provider. Make sure you discuss any questions you have with your health care provider. Document Revised: 07/23/2023 Document Reviewed: 07/15/2022 Elsevier Patient Education  2025 [employer].

## 2024-05-02 NOTE — Assessment & Plan Note (Signed)
 Reviewed what labor pain will feel like and how it is different than braxton hicks and generalized back pain. She is very nervous about birth but ready to be done with pregnancy.  Discussed 41 week IOL with Ellen. She is open to plan. Will need to schedule it at [facility].  Reviewed labor warning signs and expectations for birth. Instructed to call office or come to hospital with persistent headache, vision changes, regular contractions, leaking of fluid, decreased fetal movement or vaginal bleeding.

## 2024-05-03 ENCOUNTER — Telehealth: Payer: Self-pay

## 2024-05-03 NOTE — Telephone Encounter (Signed)
 Patient called with concern of increased back pain. She describes the pain as being constant, aching and cramping. She has not sleep all night because of the constant pain in her lower back. She has been having some contractions, but unable to time them because of the pain. She continue to have some leaking, she lost her mucous plug. She is trying to avoid coming to L&D if there is nothing wrong. She was evaluated by you yesterday. Please advise.

## 2024-05-05 NOTE — Progress Notes (Signed)
    Return Prenatal Note   Assessment/Plan   Plan  28 y.o. G2P0010 at [redacted]w[redacted]d presents for follow-up OB visit. Reviewed prenatal record including previous visit note.  Supervision of normal pregnancy -IOL scheduled for 04/16/24. Orders placed. Discussed IOL process and expectations -Reviewed labor warning signs. Instructed to call office or come to hospital with persistent headache, vision changes, regular contractions, leaking of fluid, decreased fetal movement or vaginal bleeding.      No orders of the defined types were placed in this encounter.  Return in about 1 week (around 05/15/2024).   Future Appointments  Date Time Provider Department Center  05/15/2024  8:15 AM [facility] ROOM [facility] None  05/15/2024  8:55 AM Dominic, Jinnie Jansky, CNM [facility] None    For next visit:  ROB with NST    Subjective  Jacqueline Villarreal is tired of being pregnant and is disappointed that she is not dilated. She is trying things to get into labor. Would like IOL scheduled today.  Movement: Present Contractions: Irritability  Objective   Flow sheet Vitals: Pulse Rate: 88 BP: 117/80 Fundal Height: 38 cm Fetal Heart Rate (bpm): 125 Dilation: Closed Effacement (%): 40 Station: -3 Total weight gain: 37 lb (16.8 kg)  General Appearance  No acute distress, well appearing, and well nourished Pulmonary   Normal work of breathing Neurologic   Alert and oriented to person, place, and time Psychiatric   Mood and affect within normal limits  Eleanor Canny, CNM 05/08/24 4:32 PM

## 2024-05-08 ENCOUNTER — Other Ambulatory Visit: Payer: Self-pay | Admitting: Obstetrics

## 2024-05-08 ENCOUNTER — Ambulatory Visit (INDEPENDENT_AMBULATORY_CARE_PROVIDER_SITE_OTHER): Admitting: Obstetrics

## 2024-05-08 ENCOUNTER — Encounter: Payer: Self-pay | Admitting: Obstetrics

## 2024-05-08 VITALS — BP 117/80 | HR 88 | Wt 159.0 lb

## 2024-05-08 DIAGNOSIS — Z3483 Encounter for supervision of other normal pregnancy, third trimester: Secondary | ICD-10-CM | POA: Diagnosis not present

## 2024-05-08 DIAGNOSIS — R4589 Other symptoms and signs involving emotional state: Secondary | ICD-10-CM

## 2024-05-08 NOTE — Assessment & Plan Note (Addendum)
-  IOL scheduled for 04/16/24. Orders placed. Discussed IOL process and expectations -Reviewed labor warning signs. Instructed to call office or come to hospital with persistent headache, vision changes, regular contractions, leaking of fluid, decreased fetal movement or vaginal bleeding.

## 2024-05-09 ENCOUNTER — Observation Stay
Admission: EM | Admit: 2024-05-09 | Discharge: 2024-05-10 | Disposition: A | Attending: Licensed Practical Nurse | Admitting: Licensed Practical Nurse

## 2024-05-09 ENCOUNTER — Encounter: Payer: Self-pay | Admitting: Obstetrics and Gynecology

## 2024-05-09 ENCOUNTER — Other Ambulatory Visit: Payer: Self-pay

## 2024-05-09 DIAGNOSIS — O471 False labor at or after 37 completed weeks of gestation: Principal | ICD-10-CM | POA: Insufficient documentation

## 2024-05-09 DIAGNOSIS — Z3A40 40 weeks gestation of pregnancy: Secondary | ICD-10-CM | POA: Insufficient documentation

## 2024-05-09 DIAGNOSIS — Z87891 Personal history of nicotine dependence: Secondary | ICD-10-CM | POA: Insufficient documentation

## 2024-05-09 DIAGNOSIS — Z789 Other specified health status: Secondary | ICD-10-CM

## 2024-05-09 DIAGNOSIS — Z3483 Encounter for supervision of other normal pregnancy, third trimester: Principal | ICD-10-CM

## 2024-05-09 NOTE — OB Triage Note (Signed)
 [name] 27 y.o. @[redacted]w[redacted]d  G2P0 presents to Labor & Delivery triage via wheelchair steered by ED staff reporting 10/10 constant back pain that started around 1900 and contractions 5/10 starting around 2000. Jacqueline Villarreal She denies signs and symptoms consistent with rupture of membranes or active vaginal bleeding. She states positive fetal movement. External FM and TOCO applied to non-tender abdomen. Initial FHR 130.  SVE 1/50/-1. Vital signs obtained and within normal limits. Patient oriented to care environment including call bell and bed control use. Dominic, CNM notified of patient's arrival. Plan to labor eval. Sullivan Blasing L. Eliott Amparan, RN BSN 05/09/2024 10:54 PM

## 2024-05-10 ENCOUNTER — Telehealth: Payer: Self-pay

## 2024-05-10 DIAGNOSIS — O26893 Other specified pregnancy related conditions, third trimester: Secondary | ICD-10-CM | POA: Diagnosis not present

## 2024-05-10 DIAGNOSIS — R109 Unspecified abdominal pain: Secondary | ICD-10-CM | POA: Diagnosis not present

## 2024-05-10 DIAGNOSIS — O480 Post-term pregnancy: Secondary | ICD-10-CM

## 2024-05-10 DIAGNOSIS — Z3A40 40 weeks gestation of pregnancy: Secondary | ICD-10-CM

## 2024-05-10 DIAGNOSIS — M545 Low back pain, unspecified: Secondary | ICD-10-CM | POA: Diagnosis not present

## 2024-05-10 DIAGNOSIS — O471 False labor at or after 37 completed weeks of gestation: Secondary | ICD-10-CM | POA: Diagnosis present

## 2024-05-10 DIAGNOSIS — Z87891 Personal history of nicotine dependence: Secondary | ICD-10-CM | POA: Diagnosis not present

## 2024-05-10 NOTE — Discharge Summary (Signed)
 Physician Final Progress Note  Patient ID: Jacqueline Villarreal MRN: [id number] DOB/AGE: 10-05-1995 28 y.o.  Admit date: 05/09/2024 Admitting provider: Garnette JONETTA Mace, MD Discharge date: 05/10/2024   Admission Diagnoses:  1) intrauterine pregnancy at [redacted]w[redacted]d  2) contractions   Discharge Diagnoses:  Active Problems:   Indication for care in labor and delivery, antepartum  Latent labor   History of Present Illness: The patient is a 28 y.o. female G2P0010 at [redacted]w[redacted]d who presents for contractions and low back pain. She was active all day cleaning her house. Around 1900 her back started to suddenly hurt and then around 2000 contractions started, initially they were every 3 mins. She did not take any Tylenol  for the pain. She did get in the shower, which did not help the back pain or contractions. She endorses +FM, denies LOF/VB.   Past Medical History:  Diagnosis Date   Ovarian cyst     Past Surgical History:  Procedure Laterality Date   MOUTH SURGERY     when pt had braces    No current facility-administered medications on file prior to encounter.   Current Outpatient Medications on File Prior to Encounter  Medication Sig Dispense Refill   Prenatal Vit-Fe Fumarate-FA (PRENATAL MULTIVITAMIN) TABS tablet Take 1 tablet by mouth daily at 12 noon. (Patient not taking: No sig reported)      No Known Allergies  Social History   Socioeconomic History   Marital status: Significant Other    Spouse name: issac   Number of children: 0   Years of education: Not on file   Highest education level: GED or equivalent  Occupational History   Not on file  Tobacco Use   Smoking status: Former    Types: E-cigarettes   Smokeless tobacco: Never   Tobacco comments:    I vape every day but am decreasing the use  Vaping Use   Vaping status: Every Day   Substances: Nicotine , Flavoring  Substance and Sexual Activity   Alcohol use: Not Currently    Alcohol/week: 1.0 standard drink of  alcohol    Types: 1 Glasses of wine per week    Comment: occasionally   Drug use: No   Sexual activity: Yes    Birth control/protection: Pill  Other Topics Concern   Not on file  Social History Narrative   Not on file   Social Drivers of Health   Financial Resource Strain: Low Risk  (10/01/2023)   Overall Financial Resource Strain (CARDIA)    Difficulty of Paying Living Expenses: Not very hard  Food Insecurity: No Food Insecurity (10/01/2023)   Hunger Vital Sign    Worried About Running Out of Food in the Last Year: Never true    Ran Out of Food in the Last Year: Never true  Transportation Needs: No Transportation Needs (10/01/2023)   PRAPARE - Administrator, Civil Service (Medical): No    Lack of Transportation (Non-Medical): No  Physical Activity: Sufficiently Active (10/01/2023)   Exercise Vital Sign    Days of Exercise per Week: 5 days    Minutes of Exercise per Session: 40 min  Stress: No Stress Concern Present (10/01/2023)   [employer] of Occupational Health - Occupational Stress Questionnaire    Feeling of Stress : Not at all  Social Connections: Moderately Integrated (10/01/2023)   Social Connection and Isolation Panel    Frequency of Communication with Friends and Family: More than three times a week    Frequency of Social Gatherings  with Friends and Family: More than three times a week    Attends Religious Services: 1 to 4 times per year    Active Member of Clubs or Organizations: No    Attends Banker Meetings: Never    Marital Status: Living with partner  Intimate Partner Violence: Not At Risk (06/24/2023)   Humiliation, Afraid, Rape, and Kick questionnaire    Fear of Current or Ex-Partner: No    Emotionally Abused: No    Physically Abused: No    Sexually Abused: No    Family History  Problem Relation Age of Onset   Heart block Mother    Diabetes Mother    Heart attack Father    Diabetes Brother    Breast cancer Paternal Aunt     Cancer Paternal Grandmother    Irritable bowel syndrome Paternal Grandmother      ROS see HPI   Physical Exam: Temp 98.2 F (36.8 C) (Oral)   Ht 5' 4 (1.626 m)   Wt 72.1 kg   LMP 05/20/2023   BMI 27.29 kg/m   Physical Exam Constitutional:      Appearance: Normal appearance.  Genitourinary:     Vulva normal.     Genitourinary Comments: Dilation: 1 Effacement (%): 50 Station: -1 Presentation: Vertex Exam by:: Amirah Goerke, CNM   Cardiovascular:     Rate and Rhythm: Normal rate.  Pulmonary:     Effort: Pulmonary effort is normal.  Abdominal:     Comments: Gravid   Musculoskeletal:        General: Normal range of motion.     Cervical back: Normal range of motion.     Right lower leg: No edema.     Left lower leg: Edema present.  Neurological:     General: No focal deficit present.     Mental Status: She is alert.  Skin:    General: Skin is warm.  Psychiatric:        Mood and Affect: Mood normal.        Thought Content: Thought content normal.    Jacqueline Villarreal April 27, 1996 [redacted]w[redacted]d  Fetus A Non-Stress Test Interpretation for 05/10/24  Indication: Triage visit   Fetal Heart Rate A Mode: External Baseline Rate (A): 130 bpm Variability: Moderate Accelerations: 15 x 15 Decelerations: None  Uterine Activity Mode: Toco Contraction Frequency (min): 3-5 Contraction Duration (sec): 50-80 Contraction Quality: Moderate Resting Tone Palpated: Relaxed Resting Time: Adequate      Consults: None  Significant Findings/ Diagnostic Studies: NA  Procedures: RNST  Hospital Course: The patient was admitted to Labor and Delivery Triage for observation. She was offered medication for the back pain, declined. After 2 hours the pt reported her back pain was not as bad contractions were still present but have not changed in intensity. She declined a second exam and desired to go home. Stressed the importance of rest/sleep in early labor,offered Benadryl-declined.  Discussed signs of progress. Return when contractions are more intense, if your water breaks, have vaginal bleeding or concerns for fetal movement.   Discharge Condition: stable  Disposition: Discharge disposition: 01-Home or Self Care       Diet: Regular diet  Discharge Activity: Activity as tolerated   Allergies as of 05/10/2024   No Known Allergies      Medication List     TAKE these medications    prenatal multivitamin Tabs tablet Take 1 tablet by mouth daily at 12 noon.         Total  time spent taking care of this patient: 20 minutes  Signed: JINNIE HERO [facility], CNM  05/10/2024, 1:07 AM

## 2024-05-10 NOTE — Telephone Encounter (Signed)
 Jacqueline Villarreal called triage line stating she was at [employer] was having some contractions 3-4 mins apart, now that she's home resting she's not having any contractions. I advised her to continue to relax and when she starts feeling the contractions more together in that 3-4 min mark then go to the L& D to be checked since she's 40w 1d

## 2024-05-11 ENCOUNTER — Observation Stay (HOSPITAL_BASED_OUTPATIENT_CLINIC_OR_DEPARTMENT_OTHER)
Admission: EM | Admit: 2024-05-11 | Discharge: 2024-05-11 | Disposition: A | Discharge status: Home / Self Care | Source: Home / Self Care | Admitting: Advanced Practice Midwife

## 2024-05-11 ENCOUNTER — Inpatient Hospital Stay: Admitting: Anesthesiology

## 2024-05-11 ENCOUNTER — Encounter: Payer: Self-pay | Admitting: Obstetrics and Gynecology

## 2024-05-11 ENCOUNTER — Encounter: Payer: Self-pay | Admitting: Obstetrics

## 2024-05-11 ENCOUNTER — Inpatient Hospital Stay
Admission: EM | Admit: 2024-05-11 | Discharge: 2024-05-13 | DRG: 806 | Disposition: A | Discharge status: Home / Self Care | Attending: Obstetrics and Gynecology | Admitting: Obstetrics and Gynecology

## 2024-05-11 ENCOUNTER — Other Ambulatory Visit: Payer: Self-pay

## 2024-05-11 DIAGNOSIS — O99334 Smoking (tobacco) complicating childbirth: Secondary | ICD-10-CM | POA: Diagnosis present

## 2024-05-11 DIAGNOSIS — O0001 Abdominal pregnancy with intrauterine pregnancy: Secondary | ICD-10-CM | POA: Insufficient documentation

## 2024-05-11 DIAGNOSIS — F419 Anxiety disorder, unspecified: Secondary | ICD-10-CM | POA: Diagnosis not present

## 2024-05-11 DIAGNOSIS — Z3A40 40 weeks gestation of pregnancy: Secondary | ICD-10-CM

## 2024-05-11 DIAGNOSIS — F17290 Nicotine dependence, other tobacco product, uncomplicated: Secondary | ICD-10-CM | POA: Diagnosis present

## 2024-05-11 DIAGNOSIS — O99344 Other mental disorders complicating childbirth: Secondary | ICD-10-CM | POA: Diagnosis not present

## 2024-05-11 DIAGNOSIS — O471 False labor at or after 37 completed weeks of gestation: Secondary | ICD-10-CM | POA: Insufficient documentation

## 2024-05-11 DIAGNOSIS — Z23 Encounter for immunization: Secondary | ICD-10-CM | POA: Diagnosis not present

## 2024-05-11 DIAGNOSIS — Z87891 Personal history of nicotine dependence: Secondary | ICD-10-CM | POA: Insufficient documentation

## 2024-05-11 DIAGNOSIS — D62 Acute posthemorrhagic anemia: Secondary | ICD-10-CM | POA: Diagnosis not present

## 2024-05-11 DIAGNOSIS — O480 Post-term pregnancy: Secondary | ICD-10-CM

## 2024-05-11 DIAGNOSIS — Z833 Family history of diabetes mellitus: Secondary | ICD-10-CM | POA: Diagnosis not present

## 2024-05-11 DIAGNOSIS — O26893 Other specified pregnancy related conditions, third trimester: Secondary | ICD-10-CM

## 2024-05-11 DIAGNOSIS — Z789 Other specified health status: Secondary | ICD-10-CM

## 2024-05-11 DIAGNOSIS — O9081 Anemia of the puerperium: Secondary | ICD-10-CM | POA: Diagnosis not present

## 2024-05-11 DIAGNOSIS — Z8249 Family history of ischemic heart disease and other diseases of the circulatory system: Secondary | ICD-10-CM

## 2024-05-11 DIAGNOSIS — Z3483 Encounter for supervision of other normal pregnancy, third trimester: Principal | ICD-10-CM

## 2024-05-11 DIAGNOSIS — R109 Unspecified abdominal pain: Secondary | ICD-10-CM | POA: Diagnosis not present

## 2024-05-11 LAB — CBC
HCT: 33.5 % — ABNORMAL LOW (ref 36.0–46.0)
Hemoglobin: 11 g/dL — ABNORMAL LOW (ref 12.0–15.0)
MCH: 26.8 pg (ref 26.0–34.0)
MCHC: 32.8 g/dL (ref 30.0–36.0)
MCV: 81.7 fL (ref 80.0–100.0)
Platelets: 175 K/uL (ref 150–400)
RBC: 4.1 MIL/uL (ref 3.87–5.11)
RDW: 13.8 % (ref 11.5–15.5)
WBC: 13.3 K/uL — ABNORMAL HIGH (ref 4.0–10.5)
nRBC: 0 % (ref 0.0–0.2)

## 2024-05-11 LAB — TYPE AND SCREEN
ABO/RH(D): B POS
Antibody Screen: NEGATIVE

## 2024-05-11 MED ORDER — BUPIVACAINE HCL (PF) 0.25 % IJ SOLN
INTRAMUSCULAR | Status: DC | PRN
  Administered 2024-05-11: 1 mL via INTRATHECAL

## 2024-05-11 MED ORDER — ONDANSETRON HCL 4 MG/2ML IJ SOLN: 4.0000 mg | Freq: Four times a day (QID) | INTRAMUSCULAR | Status: DC | PRN

## 2024-05-11 MED ORDER — OXYTOCIN-SODIUM CHLORIDE 30-0.9 UT/500ML-% IV SOLN
INTRAVENOUS | Status: AC
Start: 2024-05-11 — End: 2024-05-11
  Administered 2024-05-11: 333 mL via INTRAVENOUS
  Filled 2024-05-11: qty 500

## 2024-05-11 MED ORDER — PHENYLEPHRINE 80 MCG/ML (10ML) SYRINGE FOR IV PUSH (FOR BLOOD PRESSURE SUPPORT): 80.0000 ug | PREFILLED_SYRINGE | INTRAVENOUS | Status: DC | PRN

## 2024-05-11 MED ORDER — ACETAMINOPHEN 500 MG PO TABS
1000.0000 mg | ORAL_TABLET | Freq: Once | ORAL | Status: DC
  Filled 2024-05-11: qty 2

## 2024-05-11 MED ORDER — DIPHENHYDRAMINE HCL 50 MG/ML IJ SOLN: 12.5000 mg | INTRAMUSCULAR | Status: DC | PRN

## 2024-05-11 MED ORDER — LACTATED RINGERS IV SOLN: INTRAVENOUS | Status: DC

## 2024-05-11 MED ORDER — SOD CITRATE-CITRIC ACID 500-334 MG/5ML PO SOLN: 30.0000 mL | ORAL | Status: DC | PRN

## 2024-05-11 MED ORDER — AMMONIA AROMATIC IN INHA
RESPIRATORY_TRACT | Status: AC
  Filled 2024-05-11: qty 10

## 2024-05-11 MED ORDER — FENTANYL-BUPIVACAINE-NACL 0.5-0.125-0.9 MG/250ML-% EP SOLN
12.0000 mL/h | EPIDURAL | Status: DC | PRN
  Filled 2024-05-11: qty 250

## 2024-05-11 MED ORDER — FENTANYL CITRATE (PF) 100 MCG/2ML IJ SOLN: 50.0000 ug | INTRAMUSCULAR | Status: DC | PRN

## 2024-05-11 MED ORDER — CLONAZEPAM 1 MG PO TBDP: 1.0000 mg | ORAL_TABLET | Freq: Two times a day (BID) | ORAL | Status: DC | PRN

## 2024-05-11 MED ORDER — LACTATED RINGERS IV SOLN
500.0000 mL | Freq: Once | INTRAVENOUS | Status: AC
  Administered 2024-05-11: 500 mL via INTRAVENOUS

## 2024-05-11 MED ORDER — LIDOCAINE HCL (PF) 1 % IJ SOLN
INTRAMUSCULAR | Status: DC | PRN
  Administered 2024-05-11: 2 mL via SUBCUTANEOUS

## 2024-05-11 MED ORDER — HYDROXYZINE HCL 25 MG PO TABS: 25.0000 mg | ORAL_TABLET | Freq: Three times a day (TID) | ORAL | Status: DC | PRN

## 2024-05-11 MED ORDER — MISOPROSTOL 200 MCG PO TABS
ORAL_TABLET | ORAL | Status: AC
  Filled 2024-05-11: qty 4

## 2024-05-11 MED ORDER — EPHEDRINE 5 MG/ML INJ: 10.0000 mg | INTRAVENOUS | Status: DC | PRN

## 2024-05-11 MED ORDER — LIDOCAINE HCL (PF) 1 % IJ SOLN: 30.0000 mL | INTRAMUSCULAR | Status: DC | PRN

## 2024-05-11 MED ORDER — BUPIVACAINE HCL (PF) 0.5 % IJ SOLN
INTRAMUSCULAR | Status: DC | PRN
  Administered 2024-05-11: 5 mL

## 2024-05-11 MED ORDER — OXYTOCIN BOLUS FROM INFUSION: 333.0000 mL | Freq: Once | INTRAVENOUS | Status: AC

## 2024-05-11 MED ORDER — OXYTOCIN-SODIUM CHLORIDE 30-0.9 UT/500ML-% IV SOLN: 2.5000 [IU]/h | INTRAVENOUS | Status: DC

## 2024-05-11 MED ORDER — FENTANYL-BUPIVACAINE-NACL 0.5-0.125-0.9 MG/250ML-% EP SOLN
EPIDURAL | Status: DC | PRN
  Administered 2024-05-11: 12 mL/h via EPIDURAL

## 2024-05-11 MED ORDER — LIDOCAINE HCL (PF) 1 % IJ SOLN
INTRAMUSCULAR | Status: AC
  Filled 2024-05-11: qty 30

## 2024-05-11 MED ORDER — LACTATED RINGERS IV SOLN: 500.0000 mL | INTRAVENOUS | Status: DC | PRN

## 2024-05-11 MED ORDER — ACETAMINOPHEN 325 MG PO TABS
650.0000 mg | ORAL_TABLET | ORAL | Status: DC | PRN
Start: 2024-05-11 — End: 2024-05-11

## 2024-05-11 MED ORDER — ACETAMINOPHEN 160 MG/5ML PO SOLN
1000.0000 mg | Freq: Once | ORAL | Status: AC
  Administered 2024-05-11: 1000 mg via ORAL
  Filled 2024-05-11: qty 40.6

## 2024-05-11 MED ORDER — OXYTOCIN 10 UNIT/ML IJ SOLN
INTRAMUSCULAR | Status: AC
  Filled 2024-05-11: qty 2

## 2024-05-11 NOTE — Discharge Summary (Signed)
 Physician Final Progress Note  Patient ID: Jacqueline Villarreal MRN: [id number] DOB/AGE: 10-14-1995 28 y.o.  Admit date: 05/11/2024 Admitting provider: Slater Rains, CNM Discharge date: 05/11/2024   Admission Diagnoses:  1) intrauterine pregnancy at [redacted]w[redacted]d  2) contractions  Discharge Diagnoses:  Active Problems:   Labor and delivery, indication for care  Latent labor  History of Present Illness: The patient is a 28 y.o. female G2P0010 at [redacted]w[redacted]d who presents for contractions that have been ongoing since yesterday at around 2000. The contractions have gotten more intense and closer together since yesterday. She is able to breathe well through the contractions. She endorses +fetal movement. Denies LOF/VB.   Past Medical History:  Diagnosis Date   Ovarian cyst     Past Surgical History:  Procedure Laterality Date   MOUTH SURGERY     when pt had braces    No current facility-administered medications on file prior to encounter.   No current outpatient medications on file prior to encounter.    No Known Allergies  Social History   Socioeconomic History   Marital status: Significant Other    Spouse name: issac   Number of children: 0   Years of education: Not on file   Highest education level: GED or equivalent  Occupational History   Not on file  Tobacco Use   Smoking status: Former    Types: E-cigarettes   Smokeless tobacco: Never   Tobacco comments:    I vape every day but am decreasing the use  Vaping Use   Vaping status: Every Day   Substances: Nicotine , Flavoring  Substance and Sexual Activity   Alcohol use: Not Currently    Alcohol/week: 1.0 standard drink of alcohol    Types: 1 Glasses of wine per week    Comment: occasionally   Drug use: No   Sexual activity: Yes    Birth control/protection: Pill  Other Topics Concern   Not on file  Social History Narrative   Not on file   Social Drivers of Health   Financial Resource Strain: Low Risk   (10/01/2023)   Overall Financial Resource Strain (CARDIA)    Difficulty of Paying Living Expenses: Not very hard  Food Insecurity: No Food Insecurity (10/01/2023)   Hunger Vital Sign    Worried About Running Out of Food in the Last Year: Never true    Ran Out of Food in the Last Year: Never true  Transportation Needs: No Transportation Needs (10/01/2023)   PRAPARE - Administrator, Civil Service (Medical): No    Lack of Transportation (Non-Medical): No  Physical Activity: Sufficiently Active (10/01/2023)   Exercise Vital Sign    Days of Exercise per Week: 5 days    Minutes of Exercise per Session: 40 min  Stress: No Stress Concern Present (10/01/2023)   [employer] of Occupational Health - Occupational Stress Questionnaire    Feeling of Stress : Not at all  Social Connections: Moderately Integrated (10/01/2023)   Social Connection and Isolation Panel    Frequency of Communication with Friends and Family: More than three times a week    Frequency of Social Gatherings with Friends and Family: More than three times a week    Attends Religious Services: 1 to 4 times per year    Active Member of [employer] or Organizations: No    Attends Banker Meetings: Never    Marital Status: Living with partner  Intimate Partner Violence: Not At Risk (06/24/2023)   Humiliation, Afraid,  Rape, and Kick questionnaire    Fear of Current or Ex-Partner: No    Emotionally Abused: No    Physically Abused: No    Sexually Abused: No    Family History  Problem Relation Age of Onset   Heart block Mother    Diabetes Mother    Heart attack Father    Diabetes Brother    Breast cancer Paternal Aunt    Cancer Paternal Grandmother    Irritable bowel syndrome Paternal Grandmother      Review of Systems  Constitutional: Negative.   HENT: Negative.    Eyes: Negative.   Respiratory: Negative.    Cardiovascular: Negative.   Gastrointestinal:  Positive for abdominal pain.   Genitourinary: Negative.   Musculoskeletal: Negative.   Skin: Negative.   Neurological: Negative.   Endo/Heme/Allergies: Negative.   Psychiatric/Behavioral: Negative.       Physical Exam: BP 122/82 (BP Location: Right Arm)   Pulse 69   Temp 98 F (36.7 C) (Oral)   Resp 17   LMP 05/20/2023   Physical Exam Constitutional:      Appearance: Normal appearance.  HENT:     Head: Normocephalic and atraumatic.  Cardiovascular:     Rate and Rhythm: Normal rate and regular rhythm.  Pulmonary:     Effort: Pulmonary effort is normal.  Abdominal:     Comments: Gravid  Neurological:     Mental Status: She is alert and oriented to person, place, and time.  Skin:    General: Skin is warm and dry.  Psychiatric:        Mood and Affect: Mood normal.   Dilation: 1 Effacement (%): 50 Station: -1 Presentation: Vertex Exam by:: R McGee RN    Jacqueline Villarreal Oct 14, 1995 [redacted]w[redacted]d  Fetus A Non-Stress Test Interpretation for 05/11/24  Indication: triage  Fetal Heart Rate A Mode: (P) External Baseline Rate (A): (P) 135 bpm Variability: (P) Moderate Accelerations: (P) 15 x 15 Decelerations: (P) None  Uterine Activity Mode: (P) Toco Contraction Frequency (min): 3-5 Contraction Duration (sec): 60-120 Contraction Quality: Moderate Resting Tone Palpated: Relaxed Resting Time: Adequate     Consults: None  Significant Findings/ Diagnostic Studies: NA  Procedures: RNST  Hospital Course: The patient was admitted to Labor and Delivery Triage for observation. The patient's cervical exam was 1/50/-1, she walked and swayed on the birth ball for two hours. After two hours the patients cervical exam was unchanged. The patient desired to go home, rest encouraged along with tylenol  and coping measures. Return when contractions are more intense, if your water breaks, have vaginal bleeding or concerns for fetal movement.   Discharge Condition: good  Disposition: Discharge disposition: 01-Home  or Self Care       Diet: Regular diet  Discharge Activity: Activity as tolerated  Discharge Instructions     Discharge activity:  No Restrictions   Complete by: As directed    Discharge diet:  No restrictions   Complete by: As directed    Notify physician for bleeding from the vagina   Complete by: As directed    Notify physician for fainting spells, black outs or loss of consciousness   Complete by: As directed    Notify physician for leaking of fluid   Complete by: As directed    Notify physician for sudden gushing of fluid from the vagina (with or without continued leaking)   Complete by: As directed    Notify physician if baby moving less than usual   Complete by:  As directed       Allergies as of 05/11/2024   No Known Allergies      Medication List    You have not been prescribed any medications.      Total time spent taking care of this patient: 30 minutes  Signed: Rosina Hamilton, Student-MidWife  05/11/2024, 7:31 AM

## 2024-05-11 NOTE — Anesthesia Procedure Notes (Addendum)
 Epidural Patient location during procedure: OB Start time: 05/11/2024 2:41 PM End time: 05/11/2024 3:02 PM  Staffing Anesthesiologist: Vicci Camellia Glatter, MD Performed: anesthesiologist   Preanesthetic Checklist Completed: patient identified, IV checked, site marked, risks and benefits discussed, surgical consent, monitors and equipment checked, pre-op evaluation and timeout performed  Epidural Patient position: sitting Prep: ChloraPrep Patient monitoring: heart rate, continuous pulse ox and blood pressure Approach: midline Location: L3-L4 Injection technique: LOR saline  Needle:  Needle type: Tuohy  Needle gauge: 18 G Needle length: 9 cm Needle insertion depth: 6 cm Catheter type: closed end Catheter size: 20 Guage Catheter at skin depth: 11 cm Test dose: negative and 1.5% lidocaine  with Epi 1:200 K  Assessment Events: blood not aspirated, no cerebrospinal fluid, injection not painful, no injection resistance and paresthesia  Additional Notes CSE attempted due to patient showing extreme pain desiring rapid pain control. With first spinal needle insertion, she complained about vagina pain and moved her back. I withdrew the tuohy and spinal needle. Pt had quick resolution of vaginal pain. A second approach with CSE resulted in involuntary leg movement and shooting leg pain. The spinal was withdrawn slightly, CSF was flowing, patient reported no more paresthesias. With insertion of epidural, patient reported back pain. The epidural was inserted with no resistance. The epidural was gently bolused with no correlating increased back pain or paresthesias. Pt was seemed to be in severe pain from contractions. She stated multiple times that her back was hurting. She had numbness in her feet with no loss of motor function. The spinal dose was likely actually an epidural dose since I withdrew in in response to the paresthesia. After 3-5 minutes of observation, I bolused the catheter  with .25% bupivacaine . She did not report increased paresthesias but did continue to report back pain. Her mental affect was of severe anxiety. She felt my touch to feet and had motor function upon leaving the room. Reason for block:procedure for pain

## 2024-05-11 NOTE — OB Triage Note (Incomplete)
 Pt is a 27yo G2P0, 40w 2d. She arrived to the unit with complaints of ctx q71mins since since she was discharged from triage this morning.  She denies vaginal bleeding, reports positive fetal movement. VS stable, monitors applied and assessing.   Initial FHT ____ at _____.

## 2024-05-11 NOTE — H&P (Signed)
 [facility] Labor & Delivery  History and Physical  ASSESSMENT AND PLAN   Jacqueline Villarreal is a 28 y.o. G2P0010 at [redacted]w[redacted]d with EDD: 05/09/2024, by Ultrasound admitted for labor.  1. Admit to L&D :   2. EFM: Continuous -- Category 1 3. Admission labs  4. Pharmacologic pain relief if desired. 5. Anticipate NSVD 6. Dr. Verdon notified of admission    Fetal Status: - cephalic presentation by US  04/10/24, Leopold's, and SVE - EFW: 7-7.5 lbs by Leopolds - FHT currently category Category I  Labs/Immunizations: Prenatal labs and studies: ABO, Rh: B/Positive/-- (04/25 1048) Antibody: Negative (04/25 1048) Rubella: 1.40 (04/25 1048) Varicella: Reactive (04/25 1048)  RPR: Non Reactive (07/28 0000)  HBsAg: Negative (04/25 1048)  HepC: Non Reactive (04/25 1048) HIV: Preliminary Reactive (07/28 0000)  GBS: Negative/-- (09/29 0412)   TDAP: yes Flu: no RSV: no  Postpartum Plan: - Feeding: Formula - Contraception: plans none - Prenatal Care Provider: AOB   HPI   Chief Complaint: Contractions  Jacqueline Villarreal is a 28 y.o. G2P0010 at [redacted]w[redacted]d who presents for labor. She was sent home from triage this morning with her cervix unchanged from 1 cm. She returned to triage and has dilated from 3 cm to 5 cm over the past 2 hours. She was offered admission and an epidural with augmentation PRN but preferred to wait to see if she was making cervical change. She is having difficulty coping with labor and has significant anxiety r/t labor and birth. She is currently declining anxiolytics but is ready for an epidural. She received regular prenatal care. Her pregnancy has been uncomplicated.  Pregnancy Complications Patient Active Problem List   Diagnosis Date Noted   Labor and delivery, indication for care 05/11/2024   [redacted] weeks gestation of pregnancy 05/11/2024   Indication for care in labor and delivery, antepartum 05/10/2024   False positive HIV serology 02/10/2024   Skin  rash 11/18/2023   Supervision of normal pregnancy 10/01/2023   Ovarian cyst 03/24/2022   Anxiety about health 10/24/2021    Review of Systems A twelve point review of systems was negative except as stated in HPI.   HISTORY   Medications No medications prior to admission.    Allergies has no known allergies.   OB History OB History  Gravida Para Term Preterm AB Living  2 0 0 0 1 0  SAB IAB Ectopic Multiple Live Births  1 0 0 0 0    # Outcome Date GA Lbr Len/2nd Weight Sex Type Anes PTL Lv  2 Current           1 SAB 2024 [redacted]w[redacted]d           Past Medical History Past Medical History:  Diagnosis Date   Ovarian cyst     Past Surgical History Past Surgical History:  Procedure Laterality Date   MOUTH SURGERY     when pt had braces    Social History  reports that she has quit smoking. Her smoking use included e-cigarettes. She has never used smokeless tobacco. She reports that she does not currently use alcohol after a past usage of about 1.0 standard drink of alcohol per week. She reports that she does not use drugs.   Family History family history includes Breast cancer in her paternal aunt; Cancer in her paternal grandmother; Diabetes in her brother and mother; Heart attack in her father; Heart block in her mother; Irritable bowel syndrome in her paternal grandmother.  PHYSICAL EXAM   Vitals:   05/11/24 1130 05/11/24 1140  BP: 129/83 126/66  Pulse: (!) 102 74  Resp:  18  Temp:  98.2 F (36.8 C)  TempSrc:  Oral    Constitutional: No acute distress, well appearing, and well nourished. Neurologic: She is alert and conversational.  Psychiatric: Anxious, tearful  Musculoskeletal: Normal gait, grossly normal range of motion Cardiovascular: Normal rate.   Pulmonary/Chest: Normal work of breathing.  Gastrointestinal/Abdominal: Soft. Gravid. There is no tenderness. Pain with contractions. Skin: Skin is warm and dry. No rash noted.  Genitourinary: Normal external  female genitalia.  SVE:   Dilation: 5 Effacement (%): 80 Cervical Position: Middle Station: -1 Presentation: Vertex Exam by:: CHRISTELLA Canny CNM   NST Interpretation  Baseline: 125 bpm Variability: moderate Accelerations: present Decelerations: none Contractions: regular, every 2-3 minutes Impression: reactive  [name], CNM

## 2024-05-11 NOTE — Progress Notes (Signed)
 Labor Progress Note   ASSESSMENT/PLAN   Jacqueline Villarreal 27 y.o.   G2P0010  at [redacted]w[redacted]d here for labor.  FWB:  - Fetal well being assessed: Category 1        GBS: - GBS negative  LABOR: -  Active labor, doing well. -  - Discussed options with patient and  - Pain Management: Epidural - Anticipate SVD   Labor Progress Cervical Exam  Last 3 exams Dil Eff Sta Exam Time  7 80 -1 1447  5 80 -1 1332  3 80 -1 1124    Active Problems:   Labor and delivery, indication for care   SUBJECTIVE/OBJECTIVE   SUBJECTIVE:  Jacqueline Villarreal is much more comfortable with her epidural. She is pleased with her progress and with SROM!   OBJECTIVE: Vital Signs: Patient Vitals for the past 12 hrs:  BP Temp Temp src Pulse Resp Height Weight  05/11/24 1603 -- -- -- -- -- 5' 4 (1.626 m) 72.1 kg  05/11/24 1349 138/67 98.7 F (37.1 C) Oral 70 (!) 22 -- --  05/11/24 1140 126/66 98.2 F (36.8 C) Oral 74 18 -- --  05/11/24 1130 129/83 -- -- (!) 102 -- -- --    Last SVE:  Dilation: 7 Effacement (%): 80 Cervical Position: Anterior Station: -1 Presentation: Vertex Exam by:: LSE -  , Rupture Date: 05/11/24, Rupture Time: 1625,    FHR:   - Baseline: 125 - Variability: moderate - Accels: present - Decels: none Category 1  UTERINE ACTIVITY:  Contractions: q 2-4 minutes  [name], CNM

## 2024-05-11 NOTE — Anesthesia Preprocedure Evaluation (Addendum)
 Anesthesia Evaluation  Patient identified by MRN, date of birth, ID band Patient awake  General Assessment Comment:In severe distress. Anxious, yelling  Reviewed: Allergy & Precautions, H&P , NPO status , Patient's Chart, lab work & pertinent test results  Airway Mallampati: II  TM Distance: >3 FB Neck ROM: full    Dental no notable dental hx.    Pulmonary neg pulmonary ROS, former smoker   Pulmonary exam normal        Cardiovascular Exercise Tolerance: Good negative cardio ROS Normal cardiovascular exam     Neuro/Psych   Anxiety        GI/Hepatic negative GI ROS,,,  Endo/Other    Renal/GU   negative genitourinary   Musculoskeletal   Abdominal   Peds  Hematology negative hematology ROS (+)   Anesthesia Other Findings Past Medical History: No date: Ovarian cyst  Past Surgical History: No date: MOUTH SURGERY     Comment:  when pt had braces     Reproductive/Obstetrics (+) Pregnancy                              Anesthesia Physical Anesthesia Plan  ASA: 2  Anesthesia Plan: Combined Spinal and Epidural   Post-op Pain Management:    Induction:   PONV Risk Score and Plan:   Airway Management Planned:   Additional Equipment:   Intra-op Plan:   Post-operative Plan:   Informed Consent: I have reviewed the patients History and Physical, chart, labs and discussed the procedure including the risks, benefits and alternatives for the proposed anesthesia with the patient or authorized representative who has indicated his/her understanding and acceptance.       Plan Discussed with: Anesthesiologist and CRNA  Anesthesia Plan Comments:          Anesthesia Quick Evaluation

## 2024-05-11 NOTE — Progress Notes (Signed)
 Labor Progress Note   ASSESSMENT/PLAN   LUCYLE ALUMBAUGH 28 y.o.   G2P0010  at [redacted]w[redacted]d here for labor.  FWB:  - Fetal well being assessed: Category 1        GBS: - GBS negative  LABOR: -  Second stage of labor, doing well. - AROM of forebag performed. Nura was able to push past the anterior lip while I reduce it - Pain Management: Epidural - Anticipate SVD    Active Problems:   Labor and delivery, indication for care   SUBJECTIVE/OBJECTIVE   SUBJECTIVE:     OBJECTIVE: Vital Signs: Patient Vitals for the past 12 hrs:  BP Temp Temp src Pulse Resp SpO2 Height Weight  05/11/24 1945 -- -- -- -- -- 95 % -- --  05/11/24 1940 -- -- -- -- -- 94 % -- --  05/11/24 1935 -- -- -- -- -- 94 % -- --  05/11/24 1930 -- -- -- -- -- 96 % -- --  05/11/24 1925 -- -- -- -- -- 97 % -- --  05/11/24 1920 (!) 124/93 -- -- (!) 103 -- 96 % -- --  05/11/24 1915 -- -- -- -- -- 96 % -- --  05/11/24 1910 -- -- -- -- -- 95 % -- --  05/11/24 1905 -- -- -- -- -- 95 % -- --  05/11/24 1900 -- -- -- -- -- 96 % -- --  05/11/24 1855 -- -- -- -- -- 97 % -- --  05/11/24 1851 -- -- -- -- -- 96 % -- --  05/11/24 1850 -- -- -- -- -- 96 % -- --  05/11/24 1845 -- -- -- -- -- 96 % -- --  05/11/24 1835 -- -- -- -- -- 97 % -- --  05/11/24 1830 -- -- -- -- -- 96 % -- --  05/11/24 1825 -- -- -- -- -- 97 % -- --  05/11/24 1822 124/72 -- -- 73 -- -- -- --  05/11/24 1820 -- -- -- -- -- 97 % -- --  05/11/24 1815 -- -- -- -- -- 97 % -- --  05/11/24 1810 -- -- -- -- -- 98 % -- --  05/11/24 1805 -- -- -- -- -- 100 % -- --  05/11/24 1800 -- -- -- -- -- 99 % -- --  05/11/24 1755 -- -- -- -- -- 98 % -- --  05/11/24 1750 -- -- -- -- -- 97 % -- --  05/11/24 1745 -- -- -- -- -- 96 % -- --  05/11/24 1740 -- -- -- -- -- 96 % -- --  05/11/24 1735 -- -- -- -- -- 97 % -- --  05/11/24 1730 -- -- -- -- -- 97 % -- --  05/11/24 1725 -- -- -- -- -- 97 % -- --  05/11/24 1722 128/74 -- -- 80 -- -- -- --  05/11/24 1720 -- -- -- -- --  96 % -- --  05/11/24 1715 -- -- -- -- -- 97 % -- --  05/11/24 1710 -- -- -- -- -- 96 % -- --  05/11/24 1706 -- -- -- -- -- 96 % -- --  05/11/24 1705 -- -- -- -- -- 96 % -- --  05/11/24 1700 -- -- -- -- -- 97 % -- --  05/11/24 1655 -- -- -- -- -- 97 % -- --  05/11/24 1650 -- -- -- -- -- 98 % -- --  05/11/24 1645 -- -- -- -- -- 99 % -- --  05/11/24 1640 -- -- -- -- -- 99 % -- --  05/11/24 1635 -- -- -- -- -- 97 % -- --  05/11/24 1630 -- -- -- -- -- 99 % -- --  05/11/24 1625 -- -- -- -- -- 99 % -- --  05/11/24 1622 115/63 -- -- 69 -- -- -- --  05/11/24 1620 -- -- -- -- -- 98 % -- --  05/11/24 1615 -- -- -- -- -- 98 % -- --  05/11/24 1610 -- -- -- -- -- 100 % -- --  05/11/24 1607 (!) 116/45 99.1 F (37.3 C) Oral 65 -- -- -- --  05/11/24 1605 -- -- -- -- -- 100 % -- --  05/11/24 1603 -- -- -- -- -- -- 5' 4 (1.626 m) 72.1 kg  05/11/24 1600 -- -- -- -- -- 100 % -- --  05/11/24 1555 -- -- -- -- -- 100 % -- --  05/11/24 1552 (!) 114/38 -- -- 66 -- -- -- --  05/11/24 1550 -- -- -- -- -- 100 % -- --  05/11/24 1545 -- -- -- -- -- 100 % -- --  05/11/24 1540 -- -- -- -- -- 100 % -- --  05/11/24 1537 (!) 116/45 -- -- 66 -- -- -- --  05/11/24 1535 -- -- -- -- -- 100 % -- --  05/11/24 1530 -- -- -- -- -- 100 % -- --  05/11/24 1525 -- -- -- -- -- 100 % -- --  05/11/24 1522 (!) 115/54 -- -- 69 -- -- -- --  05/11/24 1521 (!) 115/54 -- -- 69 -- 100 % -- --  05/11/24 1520 -- -- -- -- -- 100 % -- --  05/11/24 1517 (!) 115/56 -- -- 75 -- -- -- --  05/11/24 1515 -- -- -- -- -- 100 % -- --  05/11/24 1500 (!) 104/52 -- -- 72 -- 99 % -- --  05/11/24 1455 -- -- -- -- -- 99 % -- --  05/11/24 1454 (!) 130/99 -- -- 92 -- -- -- --  05/11/24 1452 135/70 -- -- 77 -- -- -- --  05/11/24 1450 -- -- -- -- -- 99 % -- --  05/11/24 1349 138/67 98.7 F (37.1 C) Oral 70 (!) 22 -- -- --  05/11/24 1140 126/66 98.2 F (36.8 C) Oral 74 18 -- -- --  05/11/24 1130 129/83 -- -- (!) 102 -- -- -- --    Last SVE:   Dilation: 10 Dilation Complete Date: 05/11/24 Dilation Complete Time: 1930 Effacement (%): 100 Cervical Position: Anterior Station: -1 Presentation: Vertex Exam by:: A. Rowland,RN -  , Rupture Date: 05/11/24, Rupture Time: 1625,    FHR:   - Baseline: 120 - Variability: moderate - Accels: present - Decels: none  Category 1  UTERINE ACTIVITY:  Contractions: q 2-3 min  [name], CNM

## 2024-05-11 NOTE — Progress Notes (Signed)
 Called to patient bedside by CNM. Pushing for several hours with minimal progress, and at 11:09p fetal heart tones to the 90s. I entered the room 19 minutes later to consider operative delivery. However, excellent maternal effort and +3 station with early decels, no intervention needed. I stayed at bedside to witness a vaginal delivery with CNM Swanson, complicated by nuchal reduced on the perineum. See delivery note for full details.  Heather Penton 11:59 PM 05/12/24

## 2024-05-12 DIAGNOSIS — Z3A40 40 weeks gestation of pregnancy: Secondary | ICD-10-CM

## 2024-05-12 DIAGNOSIS — O480 Post-term pregnancy: Secondary | ICD-10-CM

## 2024-05-12 DIAGNOSIS — F419 Anxiety disorder, unspecified: Secondary | ICD-10-CM

## 2024-05-12 DIAGNOSIS — O99344 Other mental disorders complicating childbirth: Secondary | ICD-10-CM

## 2024-05-12 LAB — CBC
HCT: 23.3 % — ABNORMAL LOW (ref 36.0–46.0)
Hemoglobin: 7.6 g/dL — ABNORMAL LOW (ref 12.0–15.0)
MCH: 26.8 pg (ref 26.0–34.0)
MCHC: 32.6 g/dL (ref 30.0–36.0)
MCV: 82 fL (ref 80.0–100.0)
Platelets: 155 K/uL (ref 150–400)
RBC: 2.84 MIL/uL — ABNORMAL LOW (ref 3.87–5.11)
RDW: 13.9 % (ref 11.5–15.5)
WBC: 20.3 K/uL — ABNORMAL HIGH (ref 4.0–10.5)
nRBC: 0 % (ref 0.0–0.2)

## 2024-05-12 LAB — RPR: RPR Ser Ql: NONREACTIVE

## 2024-05-12 MED ORDER — PRENATAL MULTIVITAMIN CH
1.0000 | ORAL_TABLET | Freq: Every day | ORAL | Status: DC
Start: 2024-05-12 — End: 2024-05-13
  Administered 2024-05-12: 1 via ORAL
  Filled 2024-05-12: qty 1

## 2024-05-12 MED ORDER — ALBUTEROL SULFATE (2.5 MG/3ML) 0.083% IN NEBU: 2.5000 mg | INHALATION_SOLUTION | Freq: Once | RESPIRATORY_TRACT | Status: DC | PRN

## 2024-05-12 MED ORDER — TRANEXAMIC ACID-NACL 1000-0.7 MG/100ML-% IV SOLN: 1000.0000 mg | INTRAVENOUS | Status: AC

## 2024-05-12 MED ORDER — SIMETHICONE 80 MG PO CHEW: 80.0000 mg | CHEWABLE_TABLET | ORAL | Status: DC | PRN

## 2024-05-12 MED ORDER — OXYCODONE-ACETAMINOPHEN 5-325 MG PO TABS: 1.0000 | ORAL_TABLET | ORAL | Status: DC | PRN

## 2024-05-12 MED ORDER — TRANEXAMIC ACID-NACL 1000-0.7 MG/100ML-% IV SOLN
INTRAVENOUS | Status: AC
Start: 2024-05-12 — End: 2024-05-12
  Administered 2024-05-12: 1000 mg via INTRAVENOUS
  Filled 2024-05-12: qty 100

## 2024-05-12 MED ORDER — ONDANSETRON HCL 4 MG PO TABS: 4.0000 mg | ORAL_TABLET | ORAL | Status: DC | PRN

## 2024-05-12 MED ORDER — METHYLERGONOVINE MALEATE 0.2 MG/ML IJ SOLN: 0.2000 mg | INTRAMUSCULAR | Status: DC | PRN

## 2024-05-12 MED ORDER — TETANUS-DIPHTH-ACELL PERTUSSIS 5-2-15.5 LF-MCG/0.5 IM SUSP: 0.5000 mL | Freq: Once | INTRAMUSCULAR | Status: DC

## 2024-05-12 MED ORDER — ONDANSETRON HCL 4 MG/2ML IJ SOLN: 4.0000 mg | Freq: Once | INTRAMUSCULAR | Status: AC

## 2024-05-12 MED ORDER — DIPHENHYDRAMINE HCL 25 MG PO CAPS: 25.0000 mg | ORAL_CAPSULE | Freq: Four times a day (QID) | ORAL | Status: DC | PRN

## 2024-05-12 MED ORDER — SODIUM CHLORIDE 0.9 % IV BOLUS: 500.0000 mL | Freq: Once | INTRAVENOUS | Status: DC | PRN

## 2024-05-12 MED ORDER — EPINEPHRINE 0.3 MG/0.3ML IJ SOAJ
0.3000 mg | Freq: Once | INTRAMUSCULAR | Status: DC | PRN
  Filled 2024-05-12: qty 0.3

## 2024-05-12 MED ORDER — IBUPROFEN 100 MG/5ML PO SUSP
600.0000 mg | Freq: Four times a day (QID) | ORAL | Status: DC
  Filled 2024-05-12 (×6): qty 30

## 2024-05-12 MED ORDER — DOCUSATE SODIUM 100 MG PO CAPS
100.0000 mg | ORAL_CAPSULE | Freq: Two times a day (BID) | ORAL | Status: DC
Start: 2024-05-12 — End: 2024-05-13
  Filled 2024-05-12 (×2): qty 1

## 2024-05-12 MED ORDER — BENZOCAINE-MENTHOL 20-0.5 % EX AERO
1.0000 | INHALATION_SPRAY | CUTANEOUS | Status: DC | PRN
  Filled 2024-05-12 (×2): qty 56

## 2024-05-12 MED ORDER — DIBUCAINE (PERIANAL) 1 % EX OINT
TOPICAL_OINTMENT | CUTANEOUS | Status: DC | PRN
  Filled 2024-05-12 (×2): qty 28

## 2024-05-12 MED ORDER — METHYLERGONOVINE MALEATE 0.2 MG PO TABS: 0.2000 mg | ORAL_TABLET | ORAL | Status: DC | PRN

## 2024-05-12 MED ORDER — WITCH HAZEL-GLYCERIN EX PADS
MEDICATED_PAD | CUTANEOUS | Status: AC
  Filled 2024-05-12: qty 100

## 2024-05-12 MED ORDER — ONDANSETRON HCL 4 MG/2ML IJ SOLN
INTRAMUSCULAR | Status: AC
  Administered 2024-05-12: 4 mg via INTRAVENOUS
  Filled 2024-05-12: qty 2

## 2024-05-12 MED ORDER — IBUPROFEN 100 MG/5ML PO SUSP
600.0000 mg | Freq: Four times a day (QID) | ORAL | Status: DC
  Administered 2024-05-12 – 2024-05-13 (×3): 600 mg via ORAL
  Filled 2024-05-12 (×8): qty 30

## 2024-05-12 MED ORDER — OXYTOCIN-SODIUM CHLORIDE 30-0.9 UT/500ML-% IV SOLN: 2.5000 [IU]/h | INTRAVENOUS | Status: DC | PRN

## 2024-05-12 MED ORDER — KETOROLAC TROMETHAMINE 30 MG/ML IJ SOLN
INTRAMUSCULAR | Status: AC
  Filled 2024-05-12: qty 1

## 2024-05-12 MED ORDER — KETOROLAC TROMETHAMINE 30 MG/ML IJ SOLN
30.0000 mg | Freq: Once | INTRAMUSCULAR | Status: AC
Start: 2024-05-12 — End: 2024-05-12
  Administered 2024-05-12: 30 mg via INTRAVENOUS

## 2024-05-12 MED ORDER — METHYLPREDNISOLONE SODIUM SUCC 125 MG IJ SOLR
125.0000 mg | Freq: Once | INTRAMUSCULAR | Status: DC | PRN
  Filled 2024-05-12: qty 2

## 2024-05-12 MED ORDER — WITCH HAZEL-GLYCERIN EX PADS
MEDICATED_PAD | CUTANEOUS | Status: DC | PRN
  Filled 2024-05-12 (×2): qty 100

## 2024-05-12 MED ORDER — ONDANSETRON HCL 4 MG/2ML IJ SOLN: 4.0000 mg | INTRAMUSCULAR | Status: DC | PRN

## 2024-05-12 MED ORDER — IRON SUCROSE 500 MG IVPB - SIMPLE MED
500.0000 mg | Freq: Once | INTRAVENOUS | Status: AC
  Administered 2024-05-12: 500 mg via INTRAVENOUS
  Filled 2024-05-12: qty 500

## 2024-05-12 MED ORDER — SODIUM CHLORIDE 0.9 % IV SOLN: INTRAVENOUS | Status: AC | PRN

## 2024-05-12 MED ORDER — COCONUT OIL OIL: 1.0000 | TOPICAL_OIL | Status: DC | PRN

## 2024-05-12 MED ORDER — DIPHENHYDRAMINE HCL 50 MG/ML IJ SOLN: 25.0000 mg | Freq: Once | INTRAMUSCULAR | Status: DC | PRN

## 2024-05-12 MED ORDER — ACETAMINOPHEN 160 MG/5ML PO SOLN: 650.0000 mg | ORAL | Status: DC | PRN

## 2024-05-12 NOTE — Anesthesia Postprocedure Evaluation (Signed)
 Anesthesia Post Note  Patient: Jacqueline Villarreal  Procedure(s) Performed: AN AD HOC LABOR EPIDURAL  Patient location during evaluation: Mother Baby Anesthesia Type: Epidural Level of consciousness: awake and alert and oriented Pain management: satisfactory to patient Vital Signs Assessment: post-procedure vital signs reviewed and stable Respiratory status: spontaneous breathing Cardiovascular status: stable Postop Assessment: patient able to bend at knees, no apparent nausea or vomiting, adequate PO intake and able to ambulate Anesthetic complications: no Comments: Has been able to void.   No notable events documented.   Last Vitals:  Vitals:   05/12/24 0410 05/12/24 0449  BP: 117/67 117/69  Pulse: 95 72  Resp:  18  Temp:  36.9 C  SpO2:  99%    Last Pain:  Vitals:   05/12/24 0500  TempSrc:   PainSc: 0-No pain                 Sharryn Belding Dyane

## 2024-05-12 NOTE — Discharge Summary (Signed)
 Postpartum Discharge Summary     Patient Name: Jacqueline Villarreal DOB: 02-20-1996 MRN: [id number]  Date of admission: 05/11/2024 Delivery date:05/11/2024 Delivering provider: JUSTINO ELEANOR HERO Date of discharge: 05/13/2024  Admitting diagnosis: Labor and delivery, indication for care [O75.9] Intrauterine pregnancy: [redacted]w[redacted]d     Secondary diagnosis:  Anxiety- declined medication or therapy  Additional problems: none  Discharge diagnosis: Term Pregnancy Delivered                                              Post partum procedures:Iron infusion Augmentation: AROM of forebag Complications: None  Hospital course: Onset of Labor With Vaginal Delivery      28 y.o. yo G2P0010 at [redacted]w[redacted]d was admitted in Latent Labor on 05/11/2024. Labor course was uncomplicated  Membrane Rupture Time/Date: 4:25 PM,05/11/2024  Delivery Method:Vaginal, Spontaneous Operative Delivery:N/A Episiotomy: None Lacerations:  2nd degree;Labial Patient had a postpartum course complicated by anemia. She received, and tolerated, an iron infusion.  She continues to be very anxious about her health and her baby's health.  She is ambulating, tolerating a regular diet, passing flatus, and urinating well. Patient is discharged home in stable condition on 05/13/24.  Newborn Data: Birth date:05/11/2024 Birth time:11:51 PM Gender:Female Living status:Living Apgars:8 ,9  Weight:3780 g  Magnesium Sulfate received: No BMZ received: No Rhophylac:N/A MMR:N/A T-DaP:Given prenatally Flu: No Declines RSV Vaccine received: No Transfusion:No Immunizations administered: Immunization History  Administered Date(s) Administered   Hepatitis B 08/12/1998, 11/05/1998, 03/13/1999   MMR 07/23/1999, 11/22/2003   Tdap 03/04/2010, 06/18/2022, 02/22/2024   Varicella 11/22/2003    Physical exam  Vitals:   05/12/24 1711 05/12/24 1942 05/13/24 0036 05/13/24 0827  BP: 135/74 122/73 124/84 118/67  Pulse: 89 82 65 63  Resp: 18 20 18 18    Temp: 98.3 F (36.8 C) 98.5 F (36.9 C)  98 F (36.7 C)  TempSrc: Oral     SpO2: 100% 99% 100% (!) 80%  Weight:      Height:       General: alert and cooperative, anxious affect Lochia: appropriate Uterine Fundus: firm Incision: N/A Perineum: Labia minora are swollen, but not bruised. Laceration edges are well approximated. DVT Evaluation: No evidence of DVT seen on physical exam. Labs: Lab Results  Component Value Date   WBC 20.3 (H) 05/12/2024   HGB 7.6 (L) 05/12/2024   HCT 23.3 (L) 05/12/2024   MCV 82.0 05/12/2024   PLT 155 05/12/2024      Latest Ref Rng & Units 06/27/2023   11:33 AM  CMP  Glucose 70 - 99 mg/dL 872   BUN 6 - 20 mg/dL 10   Creatinine 9.55 - 1.00 mg/dL 9.38   Sodium 864 - 854 mmol/L 137   Potassium 3.5 - 5.1 mmol/L 3.7   Chloride 98 - 111 mmol/L 106   CO2 22 - 32 mmol/L 25   Calcium 8.9 - 10.3 mg/dL 9.3    Edinburgh Score:    05/12/2024    5:00 AM  Edinburgh Postnatal Depression Scale Screening Tool  I have been able to laugh and see the funny side of things. 0  I have looked forward with enjoyment to things. 0  I have blamed myself unnecessarily when things went wrong. 0  I have been anxious or worried for no good reason. 0  I have felt scared or panicky for no good reason.  0  Things have been getting on top of me. 0  I have been so unhappy that I have had difficulty sleeping. 0  I have felt sad or miserable. 0  I have been so unhappy that I have been crying. 0  The thought of harming myself has occurred to me. 0  Edinburgh Postnatal Depression Scale Total 0      After visit meds:  Allergies as of 05/13/2024   No Known Allergies      Medication List     TAKE these medications    acetaminophen  160 MG/5ML solution Commonly known as: TYLENOL  Take 20.3 mLs (650 mg total) by mouth every 4 (four) hours as needed for mild pain (pain score 1-3), moderate pain (pain score 4-6) or fever.   benzocaine-Menthol 20-0.5 % Aero Commonly  known as: DERMOPLAST Apply 1 Application topically as needed for irritation (perineal discomfort).   dibucaine 1 % Oint Commonly known as: NUPERCAINAL Place 1 Application rectally as needed (for swelling).   docusate sodium 100 MG capsule Commonly known as: COLACE Take 1 capsule (100 mg total) by mouth 2 (two) times daily.   ibuprofen  100 MG/5ML suspension Commonly known as: ADVIL  Take 30 mLs (600 mg total) by mouth every 6 (six) hours.   prenatal multivitamin Tabs tablet Take 1 tablet by mouth daily at 12 noon.   simethicone  80 MG chewable tablet Commonly known as: MYLICON Chew 1 tablet (80 mg total) by mouth as needed for flatulence.   witch hazel-glycerin pad Commonly known as: TUCKS Apply topically as needed for irritation (swelling).         Discharge home in stable condition Infant Feeding: Bottle Infant Disposition:home with mother Discharge instruction: per After Visit Summary and Postpartum booklet. Activity: Advance as tolerated. Pelvic rest for 6 weeks.  Diet: routine diet Anticipated Birth Control: Vasectomy Postpartum Appointment:2 weeks Additional Postpartum F/U: Postpartum Depression checkup. Also given written materials on postpartum depression and anxiety as well as local resources for mental health support. Future Appointments: No future appointments.  Follow up Visit:  Follow-up Information     [facility] at [facility]. Schedule an appointment as soon as possible for a visit in 2 week(s).   Specialty: Obstetrics and Gynecology Why: 2 week telehealth visit to follow up on mood 6 week in person visit for routine postpartum follow up visit and exam Contact information: [address] [place]  [id number] [phone]                    05/13/2024 Lauraine Lakes, CNM

## 2024-05-12 NOTE — Progress Notes (Signed)
 Subjective:   Jacqueline Villarreal had a NSVB on 05/11/2024. Her labor was uncomplicated. Has had routine postpartum care.  Eating, hydrating, and voiding regularly without difficulty. Since delivery has not had BM. She is breast feeding and bottle feeding. Reports mild/moderate vaginal bleeding, denies passing large blood clots. Has had cramping abdomen pain relieved with tylenol /ibuprofen . Plans to use vasectomy for contraception. Continued anxiety about her & baby's wellbeing, partner at bedside & supportive. Endorses good support from partner and family.   Objective:  Vital signs in last 24 hours: Temp:  [98.2 F (36.8 C)-99.2 F (37.3 C)] 98.3 F (36.8 C) (10/31 0805) Pulse Rate:  [65-107] 80 (10/31 0805) Resp:  [18-22] 18 (10/31 0805) BP: (104-145)/(38-99) 122/84 (10/31 0805) SpO2:  [94 %-100 %] 99 % (10/31 0805) Weight:  [72.1 kg] 72.1 kg (10/30 1603)    General: NAD Pulmonary: no increased work of breathing Breasts: soft, non-tender, nipples without breakdown Abdomen: soft, non-tender Fundus: firm, midline, at umbilicus Lochia: light rubra, no clots Perineum: no erythema or foul odor discharge, minimal edema, laceration well approximated  Extremities: no edema, no erythema, no tenderness  Results for orders placed or performed during the hospital encounter of 05/11/24 (from the past 72 hours)  CBC     Status: Abnormal   Collection Time: 05/11/24  1:58 PM  Result Value Ref Range   WBC 13.3 (H) 4.0 - 10.5 K/uL   RBC 4.10 3.87 - 5.11 MIL/uL   Hemoglobin 11.0 (L) 12.0 - 15.0 g/dL   HCT 66.4 (L) 63.9 - 53.9 %   MCV 81.7 80.0 - 100.0 fL   MCH 26.8 26.0 - 34.0 pg   MCHC 32.8 30.0 - 36.0 g/dL   RDW 86.1 88.4 - 84.4 %   Platelets 175 150 - 400 K/uL   nRBC 0.0 0.0 - 0.2 %    Comment: Performed at [facility], [address] [place], [place] [address]  Type and screen     Status: None   Collection Time: 05/11/24  1:58 PM  Result Value Ref Range   ABO/RH(D) B POS     Antibody Screen NEG    Sample Expiration      05/14/2024,2359 Performed at [facility], [address] [place], [place] [address]   RPR     Status: None   Collection Time: 05/11/24  1:58 PM  Result Value Ref Range   RPR Ser Ql NON REACTIVE NON REACTIVE    Comment: Performed at [facility], [address] [place], [place] [address]  CBC     Status: Abnormal   Collection Time: 05/12/24  5:44 AM  Result Value Ref Range   WBC 20.3 (H) 4.0 - 10.5 K/uL   RBC 2.84 (L) 3.87 - 5.11 MIL/uL   Hemoglobin 7.6 (L) 12.0 - 15.0 g/dL    Comment: REPEATED TO VERIFY   HCT 23.3 (L) 36.0 - 46.0 %   MCV 82.0 80.0 - 100.0 fL   MCH 26.8 26.0 - 34.0 pg   MCHC 32.6 30.0 - 36.0 g/dL   RDW 86.0 88.4 - 84.4 %   Platelets 155 150 - 400 K/uL   nRBC 0.0 0.0 - 0.2 %    Comment: Performed at [facility], [address] [place], [place] [address]     Assessment:   28 y.o. G2P0010 postpartum day 1 s/p spontaneous vaginal delivery on 05/11/2024 Bottle and Breast Anemia secondary to acute blood loss, is significant for admission- none Vital  signs Reviewed and stable Pain well controlled  Plan:    IV Fe infusing Blood Type --/--/B POS (10/30 1358) / Rubella 1.40 (04/25 1048) / Varicella Immune Rhogam: not indicated Tdap: Given prenatally Varicella:not indicated Rubella: not indicated Flu: declined Feeding plan breast & bottle, lactation support Encouraged to continue breastfeeding, BF education on latch, position changes, cluster feeding, hunger cues, lactogenesis II, milk supply Education given regarding options for contraception, as well as compatibility with breast feeding if applicable.  Patient plans on vasectomy for contraception. Continued routine postpartum care  Counseled on normal uterine involution and vaginal bleeding postpartum Anticipate discharge home tomorrow    Jacqueline Villarreal, CNM [facility] 05/12/2024, 8:26 AM

## 2024-05-13 MED ORDER — BENZOCAINE-MENTHOL 20-0.5 % EX AERO: 1.0000 | INHALATION_SPRAY | CUTANEOUS | 0 refills | Status: AC | PRN

## 2024-05-13 MED ORDER — WITCH HAZEL-GLYCERIN EX PADS: MEDICATED_PAD | CUTANEOUS | 12 refills | Status: AC | PRN

## 2024-05-13 MED ORDER — PRENATAL MULTIVITAMIN CH: 1.0000 | ORAL_TABLET | Freq: Every day | ORAL | 11 refills | Status: AC

## 2024-05-13 MED ORDER — DIBUCAINE (PERIANAL) 1 % EX OINT: 1.0000 | TOPICAL_OINTMENT | CUTANEOUS | 0 refills | Status: AC | PRN

## 2024-05-13 MED ORDER — DOCUSATE SODIUM 100 MG PO CAPS: 100.0000 mg | ORAL_CAPSULE | Freq: Two times a day (BID) | ORAL | 0 refills | Status: AC

## 2024-05-13 MED ORDER — IBUPROFEN 100 MG/5ML PO SUSP: 600.0000 mg | Freq: Four times a day (QID) | ORAL | 0 refills | Status: AC

## 2024-05-13 MED ORDER — SIMETHICONE 80 MG PO CHEW: 80.0000 mg | CHEWABLE_TABLET | ORAL | 0 refills | Status: AC | PRN

## 2024-05-13 MED ORDER — ACETAMINOPHEN 160 MG/5ML PO SOLN: 650.0000 mg | ORAL | 0 refills | Status: AC | PRN

## 2024-05-13 NOTE — Plan of Care (Signed)
  Problem: Education: Goal: Knowledge of General Education information will improve Description: Including pain rating scale, medication(s)/side effects and non-pharmacologic comfort measures Outcome: Progressing   Problem: Health Behavior/Discharge Planning: Goal: Ability to manage health-related needs will improve Outcome: Progressing   Problem: Clinical Measurements: Goal: Ability to maintain clinical measurements within normal limits will improve Outcome: Progressing Goal: Will remain free from infection Outcome: Progressing Goal: Diagnostic test results will improve Outcome: Progressing Goal: Respiratory complications will improve Outcome: Progressing Goal: Cardiovascular complication will be avoided Outcome: Progressing   Problem: Activity: Goal: Risk for activity intolerance will decrease Outcome: Progressing   Problem: Nutrition: Goal: Adequate nutrition will be maintained Outcome: Progressing   Problem: Coping: Goal: Level of anxiety will decrease Outcome: Progressing   Problem: Elimination: Goal: Will not experience complications related to bowel motility Outcome: Progressing Goal: Will not experience complications related to urinary retention Outcome: Progressing   Problem: Pain Managment: Goal: General experience of comfort will improve and/or be controlled Outcome: Progressing   Problem: Safety: Goal: Ability to remain free from injury will improve Outcome: Progressing   Problem: Skin Integrity: Goal: Risk for impaired skin integrity will decrease Outcome: Progressing   Problem: Education: Goal: Knowledge of condition will improve Outcome: Progressing   Problem: Activity: Goal: Will verbalize the importance of balancing activity with adequate rest periods Outcome: Progressing Goal: Ability to tolerate increased activity will improve Outcome: Progressing   Problem: Coping: Goal: Ability to identify and utilize available resources and services  will improve Outcome: Progressing   Problem: Life Cycle: Goal: Chance of risk for complications during the postpartum period will decrease Outcome: Progressing   Problem: Role Relationship: Goal: Ability to demonstrate positive interaction with newborn will improve Outcome: Progressing

## 2024-05-13 NOTE — Discharge Instructions (Signed)
 Urgent support: 988 Call, text or chat HandlingCost.fr  911 if immediate concern for safety  Mobile crisis teams: - [place], [place], [place]: [facility] [place]: https://freedomhouserecovery.org/service/mobile-crisis-team/ [facility]: https://rhahealthservices.org/services/crisis-services-[place]/mobile-crisis-services/  - [place]: https://integratedfamilyservices.net/services/mobile-crisis-management/  [facility] resources: https://www.stanton-reyes.com/  Walk-in assessments: [facility], [place]: https://rhahealthservices.org/locations/[place]-behavioral-health-office/  [facility], walk-ins accepted until 3pm, virtual care also available - [place]: GotForum.co.uk - [place]: NoteBack.[email or web address].za  Outpatient/clinic: [employer] [facility], [place]: https://www.http://Bucks-odom.com/ - walk-in assessments available https://www.http://www.shaw-martin.org/  [facility], [place]: HairSlick.no  [place], [place]: PermaCloud.es  Virtual: - [facility], virtual intensive outpatient group & individual therapy, medication management: https://avila-olson.com/  Inpatient: [facility] Perinatal Psychiatry Inpatient Unit: LifeHead.nl  [place], [place]: [email or web address]/locations/[email or web address]/ - walk in 24/7   [facility]ters: TanningExpo.co.nz ([phone], M-F 8-5  Peer support: [employer]partum [employer]: https://www.carson.info/  [employer] KnowRentals.no

## 2024-05-15 ENCOUNTER — Other Ambulatory Visit

## 2024-05-15 ENCOUNTER — Telehealth: Payer: Self-pay

## 2024-05-15 ENCOUNTER — Encounter: Admitting: Licensed Practical Nurse

## 2024-05-15 DIAGNOSIS — N61 Mastitis without abscess: Secondary | ICD-10-CM

## 2024-05-15 MED ORDER — DICLOXACILLIN SODIUM 500 MG PO CAPS: 500.0000 mg | ORAL_CAPSULE | Freq: Four times a day (QID) | ORAL | 0 refills | Status: DC

## 2024-05-15 MED ORDER — CEPHALEXIN 250 MG/5ML PO SUSR: 500.0000 mg | Freq: Four times a day (QID) | ORAL | 0 refills | Status: AC

## 2024-05-15 NOTE — Telephone Encounter (Addendum)
 Patient is 4 days postpartum and reports waking up today with her breasts engorged and painful.  She is formula feeding and does not want to breastfeed.  She reports having a fever, chills and body aches in the night and felt like she had the flu - she has been taking Tylenol  q 4 hours for this.  She denies any redness or streaks on her breasts.  Antibiotic sent to pharmacy for possible mastitis.  Counseled on wearing a supportive sports bra, avoiding nipple stimulation or heat on her breasts, and using cold packs to reduce discomfort and milk supply.     (Rx switched from dicloxacillin to cephalexin  as patient cannot swallow pills and dicloxacillin does not come in an oral suspension form)

## 2024-05-15 NOTE — Addendum Note (Signed)
 Addended by: VICCI ROLLO BRAVO on: 05/15/2024 10:27 AM   Modules accepted: Orders

## 2024-05-25 ENCOUNTER — Telehealth: Payer: Self-pay

## 2024-05-25 NOTE — Telephone Encounter (Signed)
 Patient calling triage with concerns of her labial laceration at birth. Gave birth two weeks ago from today and she is still having to urinate in shower with warm water because it hurts when she urinates on toilet. She is using tucks pad but she thinks something else is up. Advised Missy returns to clinic tomorrow and I would send msg/approach Missy for advise.

## 2024-05-31 NOTE — Progress Notes (Deleted)
 Pcp, No   No chief complaint on file.   HPI:      Jacqueline Villarreal is a 28 y.o. G2P1011 whose LMP was No LMP recorded., presents today for 2nd degree labial  laceration after a spontaneous vaginal delievery on 05/12/24     Patient Active Problem List   Diagnosis Date Noted   False positive HIV serology 02/10/2024   Skin rash 11/18/2023   Ovarian cyst 03/24/2022   Anxiety about health 10/24/2021    Past Surgical History:  Procedure Laterality Date   MOUTH SURGERY     when pt had braces    Family History  Problem Relation Age of Onset   Heart block Mother    Diabetes Mother    Heart attack Father    Diabetes Brother    Breast cancer Paternal Aunt    Cancer Paternal Grandmother    Irritable bowel syndrome Paternal Grandmother     Social History   Socioeconomic History   Marital status: Significant Other    Spouse name: issac   Number of children: 0   Years of education: Not on file   Highest education level: GED or equivalent  Occupational History   Not on file  Tobacco Use   Smoking status: Former    Types: E-cigarettes   Smokeless tobacco: Never   Tobacco comments:    I vape every day but am decreasing the use  Vaping Use   Vaping status: Every Day   Substances: Nicotine , Flavoring  Substance and Sexual Activity   Alcohol use: Not Currently    Alcohol/week: 1.0 standard drink of alcohol    Types: 1 Glasses of wine per week    Comment: occasionally   Drug use: No   Sexual activity: Yes    Birth control/protection: Pill  Other Topics Concern   Not on file  Social History Narrative   Not on file   Social Drivers of Health   Financial Resource Strain: Low Risk  (10/01/2023)   Overall Financial Resource Strain (CARDIA)    Difficulty of Paying Living Expenses: Not very hard  Food Insecurity: No Food Insecurity (05/11/2024)   Hunger Vital Sign    Worried About Running Out of Food in the Last Year: Never true    Ran Out of Food in the Last  Year: Never true  Transportation Needs: No Transportation Needs (05/11/2024)   PRAPARE - Administrator, Civil Service (Medical): No    Lack of Transportation (Non-Medical): No  Physical Activity: Sufficiently Active (10/01/2023)   Exercise Vital Sign    Days of Exercise per Week: 5 days    Minutes of Exercise per Session: 40 min  Stress: No Stress Concern Present (10/01/2023)   [employer] of Occupational Health - Occupational Stress Questionnaire    Feeling of Stress : Not at all  Social Connections: Moderately Integrated (10/01/2023)   Social Connection and Isolation Panel    Frequency of Communication with Friends and Family: More than three times a week    Frequency of Social Gatherings with Friends and Family: More than three times a week    Attends Religious Services: 1 to 4 times per year    Active Member of [employer] or Organizations: No    Attends Banker Meetings: Never    Marital Status: Living with partner  Intimate Partner Violence: Not At Risk (06/24/2023)   Humiliation, Afraid, Rape, and Kick questionnaire    Fear of Current or Ex-Partner: No  Emotionally Abused: No    Physically Abused: No    Sexually Abused: No    Outpatient Medications Prior to Visit  Medication Sig Dispense Refill   acetaminophen  (TYLENOL ) 160 MG/5ML solution Take 20.3 mLs (650 mg total) by mouth every 4 (four) hours as needed for mild pain (pain score 1-3), moderate pain (pain score 4-6) or fever. 120 mL 0   benzocaine -Menthol  (DERMOPLAST) 20-0.5 % AERO Apply 1 Application topically as needed for irritation (perineal discomfort). 56 g 0   dibucaine (NUPERCAINAL) 1 % OINT Place 1 Application rectally as needed (for swelling). 28 g 0   docusate sodium  (COLACE) 100 MG capsule Take 1 capsule (100 mg total) by mouth 2 (two) times daily. 10 capsule 0   ibuprofen  (ADVIL ) 100 MG/5ML suspension Take 30 mLs (600 mg total) by mouth every 6 (six) hours. 30 mL 0   Prenatal Vit-Fe  Fumarate-FA (PRENATAL MULTIVITAMIN) TABS tablet Take 1 tablet by mouth daily at 12 noon. 30 tablet 11   simethicone  (MYLICON) 80 MG chewable tablet Chew 1 tablet (80 mg total) by mouth as needed for flatulence. 30 tablet 0   witch hazel-glycerin  (TUCKS) pad Apply topically as needed for irritation (swelling). 40 each 12   No facility-administered medications prior to visit.      ROS:  Review of Systems   OBJECTIVE:   Vitals:  There were no vitals taken for this visit.  Physical Exam  Results: No results found for this or any previous visit (from the past 24 hours).   Assessment/Plan: There are no diagnoses linked to this encounter.    No orders of the defined types were placed in this encounter.    Rollo JINNY Maxin, CMA 05/31/2024 4:46 PM

## 2024-06-01 ENCOUNTER — Ambulatory Visit: Admitting: Certified Nurse Midwife

## 2024-07-06 ENCOUNTER — Emergency Department

## 2024-07-06 ENCOUNTER — Emergency Department
Admission: EM | Admit: 2024-07-06 | Discharge: 2024-07-06 | Disposition: A | Discharge status: Home / Self Care | Attending: Emergency Medicine | Admitting: Emergency Medicine

## 2024-07-06 ENCOUNTER — Other Ambulatory Visit: Payer: Self-pay

## 2024-07-06 DIAGNOSIS — N939 Abnormal uterine and vaginal bleeding, unspecified: Secondary | ICD-10-CM | POA: Diagnosis present

## 2024-07-06 DIAGNOSIS — D649 Anemia, unspecified: Secondary | ICD-10-CM | POA: Insufficient documentation

## 2024-07-06 LAB — CBC WITH DIFFERENTIAL/PLATELET
Abs Immature Granulocytes: 0 K/uL (ref 0.00–0.07)
Basophils Absolute: 0 K/uL (ref 0.0–0.1)
Basophils Relative: 1 %
Eosinophils Absolute: 0.1 K/uL (ref 0.0–0.5)
Eosinophils Relative: 3 %
HCT: 37.5 % (ref 36.0–46.0)
Hemoglobin: 11.8 g/dL — ABNORMAL LOW (ref 12.0–15.0)
Immature Granulocytes: 0 %
Lymphocytes Relative: 28 %
Lymphs Abs: 1.2 K/uL (ref 0.7–4.0)
MCH: 26.9 pg (ref 26.0–34.0)
MCHC: 31.5 g/dL (ref 30.0–36.0)
MCV: 85.6 fL (ref 80.0–100.0)
Monocytes Absolute: 0.4 K/uL (ref 0.1–1.0)
Monocytes Relative: 9 %
Neutro Abs: 2.7 K/uL (ref 1.7–7.7)
Neutrophils Relative %: 59 %
Platelets: 184 K/uL (ref 150–400)
RBC: 4.38 MIL/uL (ref 3.87–5.11)
RDW: 15.3 % (ref 11.5–15.5)
WBC: 4.4 K/uL (ref 4.0–10.5)
nRBC: 0 % (ref 0.0–0.2)

## 2024-07-06 LAB — COMPREHENSIVE METABOLIC PANEL WITH GFR
ALT: 27 U/L (ref 0–44)
AST: 28 U/L (ref 15–41)
Albumin: 4.5 g/dL (ref 3.5–5.0)
Alkaline Phosphatase: 89 U/L (ref 38–126)
Anion gap: 12 (ref 5–15)
BUN: 11 mg/dL (ref 6–20)
CO2: 24 mmol/L (ref 22–32)
Calcium: 9.3 mg/dL (ref 8.9–10.3)
Chloride: 106 mmol/L (ref 98–111)
Creatinine, Ser: 0.64 mg/dL (ref 0.44–1.00)
GFR, Estimated: >60 mL/min
Glucose, Bld: 99 mg/dL (ref 70–99)
Potassium: 3.9 mmol/L (ref 3.5–5.1)
Sodium: 141 mmol/L (ref 135–145)
Total Bilirubin: 0.4 mg/dL (ref 0.0–1.2)
Total Protein: 7.2 g/dL (ref 6.5–8.1)

## 2024-07-06 LAB — TYPE AND SCREEN
ABO/RH(D): B POS
Antibody Screen: NEGATIVE

## 2024-07-06 LAB — HCG, QUANTITATIVE, PREGNANCY: hCG, Beta Chain, Quant, S: <1 m[IU]/mL

## 2024-07-06 MED ORDER — KETOROLAC TROMETHAMINE 30 MG/ML IJ SOLN: 30.0000 mg | Freq: Once | INTRAMUSCULAR | Status: DC

## 2024-07-06 MED ORDER — FERROUS SULFATE 300 (60 FE) MG/5ML PO SOLN: 300.0000 mg | Freq: Every day | ORAL | 0 refills | Status: AC

## 2024-07-06 NOTE — Discharge Instructions (Signed)
 You may alternate over the counter Tylenol  1000 mg every 6 hours as needed for pain, fever and Ibuprofen  800 mg every 6-8 hours as needed for pain, fever.  Please take Ibuprofen  with food.  Do not take more than 4000 mg of Tylenol  (acetaminophen ) in a 24 hour period.  I recommend follow-up with your OB/GYN.  Your hemoglobin today was 11.8.  I do recommend that you take iron  for the next month and have your hemoglobin rechecked to try to optimize this to help with fatigue, body aches.  If you begin soaking through more than a pad an hour for more than 2 straight hours, feel lightheaded like you are going to pass out, chest pain or shortness of breath, severe abdominal pain, fever above one 0.4 or higher, please return to the emergency department.

## 2024-07-06 NOTE — ED Triage Notes (Signed)
 Pt to ED for vaginal bleeding x2 days, progressively getting worse. Now reports abd cramping. 8 weeks PP

## 2024-07-06 NOTE — ED Provider Notes (Signed)
 "  [facility] Provider Note    Event Date/Time   First MD Initiated Contact with Patient 07/06/24 [phone]     (approximate)   History   Vaginal Bleeding   HPI  Jacqueline Villarreal is a 28 y.o. female G2P1011 with history of ovarian cyst who underwent normal spontaneous vaginal delivery on 05/11/2024 who presents with pelvic cramping, bilateral hip pain, vaginal bleeding for the past couple of days that has increased in amount.  She is soaking through a pad in 3 to 4 hours.  She thinks she may be passing clots.  She did feel lightheaded yesterday but states she did not eat and had drinking a red bull.  No fever or chills.  No vomiting or diarrhea.  No dysuria.  No other vaginal discharge or odor.  Has not been sexually active since delivery.  She denies any concern for STIs.  She is not breast-feeding.  She is concerned for possible retained products of conception.  States she did have a second-degree tear but has no pain in her external genitalia.  She states that she did stop bleeding initially after delivery about 1 week later and then a couple weeks ago had bleeding for a few days that was very light but became concerned when she started bleeding again and it progressively got heavier.  She does state she required an iron  transfusion after her delivery but no blood transfusion.  Not on any blood thinners.  History provided by patient.    Past Medical History:  Diagnosis Date   Ovarian cyst     Past Surgical History:  Procedure Laterality Date   MOUTH SURGERY     when pt had braces    MEDICATIONS:  Prior to Admission medications  Medication Sig Start Date End Date Taking? Authorizing Provider  acetaminophen  (TYLENOL ) 160 MG/5ML solution Take 20.3 mLs (650 mg total) by mouth every 4 (four) hours as needed for mild pain (pain score 1-3), moderate pain (pain score 4-6) or fever. 05/13/24   Charma Domino, CNM  benzocaine -Menthol  (DERMOPLAST) 20-0.5 % AERO Apply 1  Application topically as needed for irritation (perineal discomfort). 05/13/24   Charma Domino, CNM  dibucaine (NUPERCAINAL) 1 % OINT Place 1 Application rectally as needed (for swelling). 05/13/24   Charma Domino, CNM  docusate sodium  (COLACE) 100 MG capsule Take 1 capsule (100 mg total) by mouth 2 (two) times daily. 05/13/24   Charma Domino, CNM  ibuprofen  (ADVIL ) 100 MG/5ML suspension Take 30 mLs (600 mg total) by mouth every 6 (six) hours. 05/13/24   Charma Domino, CNM  Prenatal Vit-Fe Fumarate-FA (PRENATAL MULTIVITAMIN) TABS tablet Take 1 tablet by mouth daily at 12 noon. 05/13/24   Charma Domino, CNM  simethicone  (MYLICON) 80 MG chewable tablet Chew 1 tablet (80 mg total) by mouth as needed for flatulence. 05/13/24   Charma Domino, CNM  witch hazel-glycerin  (TUCKS) pad Apply topically as needed for irritation (swelling). 05/13/24   Charma Domino, CNM    Physical Exam   Triage Vital Signs: ED Triage Vitals  Encounter Vitals Group     BP 07/06/24 0429 (!) 125/90     Girls Systolic BP Percentile --      Girls Diastolic BP Percentile --      Boys Systolic BP Percentile --      Boys Diastolic BP Percentile --      Pulse Rate 07/06/24 0429 72     Resp 07/06/24 0429 16     Temp 07/06/24 0429 98.9  F (37.2 C)     Temp src --      SpO2 07/06/24 0429 100 %     Weight 07/06/24 0432 130 lb (59 kg)     Height 07/06/24 0432 5' 4 (1.626 m)     Head Circumference --      Peak Flow --      Pain Score 07/06/24 0432 3     Pain Loc --      Pain Education --      Exclude from Growth Chart --     Most recent vital signs: Vitals:   07/06/24 0715 07/06/24 0717  BP:  (!) 105/58  Pulse:  74  Resp:  18  Temp:  98 F (36.7 C)  SpO2: 100% 100%    CONSTITUTIONAL: Alert, responds appropriately to questions. Well-appearing; well-nourished HEAD: Normocephalic, atraumatic EYES: Conjunctivae clear, pupils appear equal, sclera nonicteric ENT: normal nose; moist mucous membranes NECK: Supple, normal ROM CARD:  RRR; S1 and S2 appreciated RESP: Normal chest excursion without splinting or tachypnea; breath sounds clear and equal bilaterally; no wheezes, no rhonchi, no rales, no hypoxia or respiratory distress, speaking full sentences ABD/GI: Non-distended; soft, mild pelvic tenderness, no guarding or rebound, no tenderness at McBurney's point BACK: The back appears normal EXT: Normal ROM in all joints; no deformity noted, no edema SKIN: Normal color for age and race; warm; no rash on exposed skin NEURO: Moves all extremities equally, normal speech PSYCH: The patient's mood and manner are appropriate.   ED Results / Procedures / Treatments   LABS: (all labs ordered are listed, but only abnormal results are displayed) Labs Reviewed  CBC WITH DIFFERENTIAL/PLATELET - Abnormal; Notable for the following components:      Result Value   Hemoglobin 11.8 (*)    All other components within normal limits  COMPREHENSIVE METABOLIC PANEL WITH GFR  HCG, QUANTITATIVE, PREGNANCY  TYPE AND SCREEN     EKG:  EKG Interpretation Date/Time:    Ventricular Rate:    PR Interval:    QRS Duration:    QT Interval:    QTC Calculation:   R Axis:      Text Interpretation:           RADIOLOGY: My personal review and interpretation of imaging: Transvaginal ultrasound shows no acute abnormality.  No retained products of conception.  Normal blood flow to both ovaries.  I have personally reviewed all radiology reports.   US  PELVIC COMPLETE W TRANSVAGINAL AND TORSION R/O Result Date: 07/06/2024 EXAM: US  Pelvis, Complete Transvaginal and Transabdominal with Doppler 07/06/2024 06:27:00 AM TECHNIQUE: Transabdominal and transvaginal pelvic duplex ultrasound using B-mode/gray scaled imaging with Doppler spectral analysis and color flow was obtained. COMPARISON: Pelvic ultrasound 02/22/2018. CLINICAL HISTORY: 8 weeks postpartum with negative beta hcg. Pelvic pain; Vaginal bleeding. FINDINGS: UTERUS: Uterus measures 7.2  x 3.9 x 5.1 cm. Uterus is anteverted. Uterus demonstrates normal myometrial echotexture. No fibroids or other mass identified. ENDOMETRIAL STRIPE: Endometrial echo complex measures 4.2 mm. There are occasional punctate calcifications along the edges of the complex. . There is trace fluid or blood in the endocervical canal. No positive color flow within the endometrial complex to suspect retained products of conception. CERVIX: The cervix is unremarkable apart from subcentimeter simple nabothian cysts. RIGHT OVARY: Right ovary measures 3.2 x 1.8 x 1.5 cm. There are a few simple subcentimeter follicles. No suspicious lesions. There is normal arterial and venous Doppler flow. LEFT OVARY: Left ovary measures 3.2 x 2.1 x 1.8 cm. There  are a few simple subcentimeter follicles. No suspicious lesions. There is normal arterial and venous Doppler flow. FREE FLUID: There is trace anechoic pelvic cul-de-sac free fluid, which is usually physiologic at this age. IMPRESSION: 1. No evidence of retained products of conception. 2. No acute sonographic findings. Electronically signed by: Francis Quam MD 07/06/2024 06:51 AM EST RP Workstation: [id number]     PROCEDURES:  Critical Care performed: No   CRITICAL CARE    Procedures    IMPRESSION / MDM / ASSESSMENT AND PLAN / ED COURSE  I reviewed the triage vital signs and the nursing notes.    Patient here with pelvic pain, vaginal bleeding.  The patient is on the cardiac monitor to evaluate for evidence of arrhythmia and/or significant heart rate changes.   DIFFERENTIAL DIAGNOSIS (includes but not limited to):   Dysfunctional uterine bleeding, retained products of conception, ovarian cyst, uterine fibroid, anemia, doubt appendicitis, torsion, ectopic, TOA, PID   Patient's presentation is most consistent with acute presentation with potential threat to life or bodily function.   PLAN: Will obtain labs, transvaginal ultrasound with Doppler.  Patient  declines anything for pain at this time.  Will keep NPO.   MEDICATIONS GIVEN IN ED: Medications  ketorolac  (TORADOL ) 30 MG/ML injection 30 mg (30 mg Intravenous Patient Refused/Not Given 07/06/24 0447)     ED COURSE: Hemoglobin is 11.8.  Normal electrolytes.  Pregnancy test negative.  Transvaginal ultrasound reviewed and interpreted by myself and radiologist and shows no acute abnormality.  Normal endometrial stripe.  No retained products of conception.  No cyst or torsion.  Discussed with salts with patient.  Given slightly low hemoglobin we will start her on iron  for the next month that she does report intermittent fatigue and bodyaches especially at night.  Did recommend follow-up with her OB/GYN or PCP in 1 month to have her hemoglobin rechecked.  We discussed risk and benefits of TXA and given patient is hemodynamically stable and not hemorrhaging, I feel risk especially in the postpartum state outweigh benefit and she agrees with this plan.  We did discuss at length bleeding return precautions.  Recommended Tylenol , Motrin  over-the-counter.  At this time, I do not feel there is any life-threatening condition present. I reviewed all nursing notes, vitals, pertinent previous records.  All lab and urine results, EKGs, imaging ordered have been independently reviewed and interpreted by myself.  I reviewed all available radiology reports from any imaging ordered this visit.  Based on my assessment, I feel the patient is safe to be discharged home without further emergent workup and can continue workup as an outpatient as needed. Discussed all findings, treatment plan as well as usual and customary return precautions.  They verbalize understanding and are comfortable with this plan.  Outpatient follow-up has been provided as needed.  All questions have been answered.    CONSULTS:  none   OUTSIDE RECORDS REVIEWED: Reviewed delivery note.       FINAL CLINICAL IMPRESSION(S) / ED DIAGNOSES    Final diagnoses:  Vaginal bleeding  Anemia, unspecified type     Rx / DC Orders   ED Discharge Orders          Ordered    ferrous sulfate  300 (60 Fe) MG/5ML syrup  Daily with breakfast        07/06/24 0706             Note:  This document was prepared using Dragon voice recognition software and may include unintentional dictation errors.  Oscar Forman, Josette SAILOR, DO 07/06/24 [phone]  "

## 2024-07-06 NOTE — ED Notes (Signed)
[place] in room currently.

## 2024-07-21 ENCOUNTER — Other Ambulatory Visit: Payer: Self-pay

## 2024-07-21 DIAGNOSIS — N898 Other specified noninflammatory disorders of vagina: Secondary | ICD-10-CM

## 2024-07-21 MED ORDER — FLUCONAZOLE 150 MG PO TABS: 150.0000 mg | ORAL_TABLET | Freq: Once | ORAL | 0 refills | Status: AC

## 2024-07-21 NOTE — Progress Notes (Signed)
 Patient called triage complaining of thick white vaginal discharge and vaginal itching.  Rx for fluconazole  sent to patient pharmacy.

## 2024-07-24 ENCOUNTER — Telehealth: Payer: Self-pay

## 2024-07-24 NOTE — Telephone Encounter (Signed)
 Patient states she was seen at Urgent Care this weekend and was diagnosed with yeast and BV. She was treated with Diflucan  and metranidazole (liquid as she can't swallow large pills). She reports the metranidazole requires PA and Urgent Care doesn't do PA's. Inquiring if she can be seen here and treated. Advised her to contact UC and request Metrogel .

## 2024-07-24 NOTE — Telephone Encounter (Signed)
 SABRA
